# Patient Record
Sex: Male | Born: 1985 | Race: White | Marital: Single
Health system: Southern US, Community
[De-identification: ages and names within clinical notes are randomized; demographics above are authoritative.]

## PROBLEM LIST (undated history)

## (undated) HISTORY — PX: OTHER SURGICAL HISTORY: SHX169

---

## 2016-06-17 DIAGNOSIS — F988 Other specified behavioral and emotional disorders with onset usually occurring in childhood and adolescence: Secondary | ICD-10-CM | POA: Diagnosis not present

## 2016-08-26 DIAGNOSIS — R0981 Nasal congestion: Secondary | ICD-10-CM | POA: Diagnosis not present

## 2016-09-16 DIAGNOSIS — J343 Hypertrophy of nasal turbinates: Secondary | ICD-10-CM | POA: Diagnosis not present

## 2016-09-16 DIAGNOSIS — J342 Deviated nasal septum: Secondary | ICD-10-CM | POA: Diagnosis not present

## 2016-09-16 DIAGNOSIS — J310 Chronic rhinitis: Secondary | ICD-10-CM | POA: Diagnosis not present

## 2016-10-09 DIAGNOSIS — J3489 Other specified disorders of nose and nasal sinuses: Secondary | ICD-10-CM | POA: Diagnosis not present

## 2016-10-09 DIAGNOSIS — J343 Hypertrophy of nasal turbinates: Secondary | ICD-10-CM | POA: Diagnosis not present

## 2016-10-09 DIAGNOSIS — J342 Deviated nasal septum: Secondary | ICD-10-CM | POA: Diagnosis not present

## 2016-12-28 DIAGNOSIS — F988 Other specified behavioral and emotional disorders with onset usually occurring in childhood and adolescence: Secondary | ICD-10-CM | POA: Diagnosis not present

## 2017-08-11 DIAGNOSIS — F988 Other specified behavioral and emotional disorders with onset usually occurring in childhood and adolescence: Secondary | ICD-10-CM | POA: Diagnosis not present

## 2018-02-21 DIAGNOSIS — F988 Other specified behavioral and emotional disorders with onset usually occurring in childhood and adolescence: Secondary | ICD-10-CM | POA: Diagnosis not present

## 2018-06-28 DIAGNOSIS — M25552 Pain in left hip: Secondary | ICD-10-CM | POA: Diagnosis not present

## 2018-06-28 DIAGNOSIS — M76892 Other specified enthesopathies of left lower limb, excluding foot: Secondary | ICD-10-CM | POA: Diagnosis not present

## 2018-07-01 ENCOUNTER — Ambulatory Visit: Payer: BLUE CROSS/BLUE SHIELD | Attending: Family Medicine | Admitting: Physical Therapy

## 2018-07-01 ENCOUNTER — Other Ambulatory Visit: Payer: Self-pay

## 2018-07-01 ENCOUNTER — Encounter: Payer: Self-pay | Admitting: Physical Therapy

## 2018-07-01 DIAGNOSIS — R252 Cramp and spasm: Secondary | ICD-10-CM

## 2018-07-01 DIAGNOSIS — M25652 Stiffness of left hip, not elsewhere classified: Secondary | ICD-10-CM

## 2018-07-01 DIAGNOSIS — M25552 Pain in left hip: Secondary | ICD-10-CM | POA: Insufficient documentation

## 2018-07-01 DIAGNOSIS — M6281 Muscle weakness (generalized): Secondary | ICD-10-CM | POA: Insufficient documentation

## 2018-07-01 NOTE — Therapy (Signed)
[facility] [address] [place], [place], [address] Phone: [phone]   Fax:  [phone]  Physical Therapy Evaluation  Patient Details  Name: Gary Mann MRN: [id number] Date of Birth: 1986/02/12 Referring Provider: Daisy Floro, MD   Encounter Date: 07/01/2018  PT End of Session - 07/01/18 0912    Visit Number  1    Date for PT Re-Evaluation  08/12/18    PT Start Time  0830    PT Stop Time  0915    PT Time Calculation (min)  45 min    Activity Tolerance  Patient tolerated treatment well    Behavior During Therapy  [facility] for tasks assessed/performed       History reviewed. No pertinent past medical history.  Past Surgical History:  Procedure Laterality Date  . meniscus surgery Rt knee      There were no vitals filed for this visit.   Subjective Assessment - 07/01/18 0833    Subjective  Pt states pivoted on his left foot during disk golf and felt something pop and has had discomfort ever since then.  This occurred one year ago.  Pt had x-ray a few days ago that was unremarkable.      Limitations  Other (comment)   pivoting on left leg   Currently in Pain?  Yes    Pain Score  --   difficult to give number; reports is "annoying"   Pain Location  Hip    Pain Orientation  Left;Anterior    Pain Descriptors / Indicators  Pressure;Discomfort    Pain Type  Chronic pain    Pain Radiating Towards  sometimes mid thigh, never to knee    Pain Frequency  Intermittent    Aggravating Factors   worse in AM, feels hip abductor or adductor machine    Pain Relieving Factors  popping feels better for a little while    Effect of Pain on Daily Activities  no effect, but feels it all the time    Multiple Pain Sites  No         [facility] PT Assessment - 07/01/18 0001      Assessment   Medical Diagnosis  S76.012A (ICD-10-CM) - Strain of muscle, fascia and tendon of left hip, initial encounter    Referring Provider   Daisy Floro, MD    Onset Date/Surgical Date  --   one year ago   Prior Therapy  No      Precautions   Precautions  None      Restrictions   Weight Bearing Restrictions  No      Balance Screen   Has the patient fallen in the past 6 months  No      Home Environment   Living Environment  Private residence    Living Arrangements  Spouse/significant other      Prior Function   Level of Independence  Independent    Vocation  Full time employment      Cognition   Overall Cognitive Status  Within Functional Limits for tasks assessed      Observation/Other Assessments   Focus on Therapeutic Outcomes (FOTO)   31% limited      Functional Tests   Functional tests  Other      Other:   Other/ Comments  reproduction of stepping into a disk toss - forceful internal rotation on Lt hip      Posture/Postural Control   Posture/Postural Control  Postural limitations  Postural Limitations  Rounded Shoulders      ROM / Strength   AROM / PROM / Strength  Strength;PROM      PROM   Overall PROM Comments  left hip IR 30% limited; left hip ER >90 deg      Strength   Overall Strength Comments  all LE 5/5 except: left hip abduction 4+/5; left hip adduction 4+/5; right hip adduction 4+/5      Palpation   SI assessment   normal alignment    Palpation comment  TFL and adductors tight, no severe tenderness      Special Tests    Special Tests  Sacrolliac Tests    Other special tests  stretching to sacrospinus, sacrotuberous, sacrolumbar ligaments   mild pain provoked with all directions     Ambulation/Gait   Gait Pattern  Within Functional Limits                Objective measurements completed on examination: See above findings.      [facility] Adult PT Treatment/Exercise - 07/01/18 0001      Exercises   Exercises  Other Exercises    Other Exercises   stretch to IT band and hip IR             PT Education - 07/01/18 0918    Education Details   Access Code:  [email or web address]     Person(s) Educated  Patient    Methods  Explanation;Demonstration;Handout    Comprehension  Verbalized understanding;Returned demonstration       PT Short Term Goals - 07/01/18 0924      PT SHORT TERM GOAL #1   Title  ind with initial HEP    Time  3    Period  Weeks    Status  New    Target Date  07/22/18      PT SHORT TERM GOAL #2   Title  Pt will report at least 20% less pain when playing disk golf and when waking up in the morning    Time  3    Period  Weeks    Status  New    Target Date  07/22/18        PT Long Term Goals - 07/01/18 0913      PT LONG TERM GOAL #1   Title  Pt will be ind with advanced HEP    Time  6    Period  Weeks    Status  New    Target Date  08/12/18      PT LONG TERM GOAL #2   Title  FOTO will be < or = to 19% limited     Time  6    Period  Weeks    Status  New    Target Date  08/12/18      PT LONG TERM GOAL #3   Title  Pt will report ability to play disk golf confidently due to improved hip and core strength    Time  6    Period  Weeks    Status  New    Target Date  08/12/18      PT LONG TERM GOAL #4   Title  Pt will report 75% reduction in pain overall    Time  6    Period  Weeks    Status  New    Target Date  08/12/18      PT LONG TERM GOAL #5   Title  Pt will  be able to perform wide stance squat without increased pain    Time  6    Period  Weeks    Status  New    Target Date  08/12/18             Plan - 07/01/18 [id number]    Clinical Impression Statement  Pt reports to clinic due to hip pain that has been going on for a year.  Pain began after experienceing a feeling of dislocation during a game of disk golf.  Currently he has mild symptoms constantly and is aggravated when playing disk golf, hip abduction/adduction, and certain positions.  Pt has hip hypomobility with hip IR and hypermobile ER on the Lt LE.  Pt has some weakness of left hip.  Pt has increased symptoms with sacral ligament testing when place  left hip in adduction, flexion, and flexion with adduction.  Pt has muscle spasms of left TFL and bilateral adductors.  Pt overally appears to have some weakness of hip rotators, adductors and abductors with lack of core strength to stabiliize the pelvis during fast movements.  He will benefit from skilled PT to address impairments so he can safely and comfortably participate in his activies as part of a healthy lifestyle.    History and Personal Factors relevant to plan of care:  right knee meniscus surgery    Clinical Presentation  Stable    Clinical Presentation due to:  Pt is stable    Clinical Decision Making  Low    Rehab Potential  Excellent    PT Frequency  2x / week    PT Duration  6 weeks    PT Treatment/Interventions  ADLs/Self Care Home Management;Biofeedback;Cryotherapy;Electrical Stimulation;Iontophoresis 4mg /ml Dexamethasone;Moist Heat;Traction;Therapeutic activities;Therapeutic exercise;Patient/family education;Neuromuscular re-education;Manual techniques;Passive range of motion;Dry needling;Taping    PT Next Visit Plan  dry needling info and dry needle to TFL left side and adductors bilateral; hip stability and strengthening; core strength    PT Home Exercise Plan   Access Code: [email or web address]     Recommended Other Services  eval 07/01/18    Consulted and Agree with Plan of Care  Patient       Patient will benefit from skilled therapeutic intervention in order to improve the following deficits and impairments:  Pain, Decreased strength, Increased muscle spasms  Visit Diagnosis: Pain in left hip - Plan: PT plan of care cert/re-cert  Stiffness of left hip, not elsewhere classified - Plan: PT plan of care cert/re-cert  Muscle weakness (generalized) - Plan: PT plan of care cert/re-cert  Cramp and spasm - Plan: PT plan of care cert/re-cert     Problem List There are no active problems to display for this patient.   Vincente PoliJakki Crosser, PT 07/01/2018, 4:15 PM  [facility] [address] [place], [place], [address] Phone: [phone]   Fax:  [phone]  Name: Myrla HalstedMatthew Dech MRN: [id number] Date of Birth: [birth year]

## 2018-07-01 NOTE — Patient Instructions (Signed)
Access Code: [email or web address]  URL: [email or web address]  Date: 07/01/2018  Prepared by: Dorie RankJacqueline Crosser   Exercises  Supine Bilateral Hip Internal Rotation Stretch - 3 reps - 1 sets - 30 sec hold - 1x daily - 7x weekly  Standing ITB Stretch - 10 reps - 3 sets - 1x daily - 7x weekly   [facility] [address], [address] [place], [place] [address] Phone # [phone] Fax [phone]

## 2018-07-04 ENCOUNTER — Ambulatory Visit: Payer: BLUE CROSS/BLUE SHIELD | Admitting: Physical Therapy

## 2018-07-04 ENCOUNTER — Encounter: Payer: Self-pay | Admitting: Physical Therapy

## 2018-07-04 DIAGNOSIS — M25552 Pain in left hip: Secondary | ICD-10-CM | POA: Diagnosis not present

## 2018-07-04 DIAGNOSIS — M25652 Stiffness of left hip, not elsewhere classified: Secondary | ICD-10-CM

## 2018-07-04 DIAGNOSIS — R252 Cramp and spasm: Secondary | ICD-10-CM

## 2018-07-04 DIAGNOSIS — M6281 Muscle weakness (generalized): Secondary | ICD-10-CM

## 2018-07-04 NOTE — Therapy (Signed)
[facility] [address] [place], [place], [address] Phone: [phone]   Fax:  [phone]  Physical Therapy Treatment  Patient Details  Name: Gary Mann MRN: [id number] Date of Birth: 1986-08-23 Referring Provider: Daisy Floro, MD   Encounter Date: 07/04/2018  PT End of Session - 07/04/18 1147    Visit Number  2    Date for PT Re-Evaluation  08/12/18    PT Start Time  1144    PT Stop Time  1230    PT Time Calculation (min)  46 min    Activity Tolerance  Patient tolerated treatment well    Behavior During Therapy  [facility] for tasks assessed/performed       History reviewed. No pertinent past medical history.  Past Surgical History:  Procedure Laterality Date  . meniscus surgery Rt knee      There were no vitals filed for this visit.  Subjective Assessment - 07/04/18 1148    Subjective  Just my annoying pain.    Currently in Pain?  Yes    Pain Score  3     Pain Location  Hip    Pain Orientation  Left;Anterior;Lateral    Pain Descriptors / Indicators  Constant    Multiple Pain Sites  No                       [facility] Adult PT Treatment/Exercise - 07/04/18 0001      Neuro Re-ed    Neuro Re-ed Details   Verbal ed for increasing his perception and awareness of weight bearing throughout the LTLE during the bridge exercise and during standing exercises/activites   Pt could demo correctly     Knee/Hip Exercises: Stretches   ITB Stretch  Left;3 reps;20 seconds    Other Knee/Hip Stretches  Soft foam roll release for LT TFL and adductors.     Other Knee/Hip Stretches  Hip IR stretch Lt 3x 20 sec    Yoga traction twist stretch in supine, Vc for initial instru     Knee/Hip Exercises: Aerobic   Nustep  L5 x 6 min    PTA present for status update     Knee/Hip Exercises: Seated   Other Seated Knee/Hip Exercises  Hip isometric ER 5 sec hold 8x      Knee/Hip Exercises: Supine   Bridges   Strengthening;Both;1 set;10 reps             PT Education - 07/04/18 1504    Education Details  Supine bridge and seated hip ER isometrics for HEP    Person(s) Educated  Patient    Methods  Explanation;Demonstration;Tactile cues;Verbal cues;Handout    Comprehension  Returned demonstration;Verbalized understanding       PT Short Term Goals - 07/04/18 1500      PT SHORT TERM GOAL #1   Title  ind with initial HEP    Time  3    Period  Weeks    Status  Achieved        PT Long Term Goals - 07/01/18 0913      PT LONG TERM GOAL #1   Title  Pt will be ind with advanced HEP    Time  6    Period  Weeks    Status  New    Target Date  08/12/18      PT LONG TERM GOAL #2   Title  FOTO will be < or = to 19%  limited     Time  6    Period  Weeks    Status  New    Target Date  08/12/18      PT LONG TERM GOAL #3   Title  Pt will report ability to play disk golf confidently due to improved hip and core strength    Time  6    Period  Weeks    Status  New    Target Date  08/12/18      PT LONG TERM GOAL #4   Title  Pt will report 75% reduction in pain overall    Time  6    Period  Weeks    Status  New    Target Date  08/12/18      PT LONG TERM GOAL #5   Title  Pt will be able to perform wide stance squat without increased pain    Time  6    Period  Weeks    Status  New    Target Date  08/12/18            Plan - 07/04/18 1214    Clinical Impression Statement  Pt presented today with same anteriolateral LT hip "annoying" pain. He reports doing a gym routine already this AM. This consisted of a comprehensive LE machine workout; leg press, squats, and add/abd machine. He is compliant and independent in his initial HEP. PTA educated pt in ways to use the soft foam roll to perform release work to his LT TFL/IT band/and adductor group. He has a hard foam roll at home that is painful to use. Pt reports the soft foam roll was signifcantly more comfortable and could more  easily get to his sore "spots." He may order soft one for home use. Pt was educated in the importance of stabilization wrok for the hip and perhaps that was missing piece of his exercise regime. During the bridge exercise pt put less weight inot his LTLE with no awareness of this. When verbally cued to put more weight into his foot/leg he could "feel" his hip better and this produced a more visable shake at his hip muscluature. The bridge and isometric hip Er was added to HEP.     Rehab Potential  Excellent    PT Frequency  2x / week    PT Duration  6 weeks    PT Treatment/Interventions  ADLs/Self Care Home Management;Biofeedback;Cryotherapy;Electrical Stimulation;Iontophoresis 4mg /ml Dexamethasone;Moist Heat;Traction;Therapeutic activities;Therapeutic exercise;Patient/family education;Neuromuscular re-education;Manual techniques;Passive range of motion;Dry needling;Taping    PT Next Visit Plan  Pt might like to try dry needling next session; Lt TFL/LT hip adductors. Continue with hip stabilization along with the core    PT Home Exercise Plan   Access Code: [email or web address]     Consulted and Agree with Plan of Care  Patient       Patient will benefit from skilled therapeutic intervention in order to improve the following deficits and impairments:  Pain, Decreased strength, Increased muscle spasms  Visit Diagnosis: Pain in left hip  Stiffness of left hip, not elsewhere classified  Muscle weakness (generalized)  Cramp and spasm     Problem List There are no active problems to display for this patient.   Gary Mann, PTA 07/04/2018, 3:05 PM  [facility] [address] [place], [place], [address] Phone: [phone]   Fax:  [phone]  Name: Gary Mann MRN: [id number] Date of Birth: [birth year]  Access Code: [email or web address]  URL: [email or web address]  Date: 07/04/2018  Prepared by: Gary Mann   Exercises   Supine Bilateral Hip Internal Rotation Stretch - 3 reps - 1 sets - 30 sec hold - 1x daily - 7x weekly  Standing ITB Stretch - 10 reps - 3 sets - 1x daily - 7x weekly  Supine Bridge - 10 reps - 3 sets - 1x daily - 7x weekly  Seated Heel Squeeze - 10 reps - 3 sets - 1x daily - 7x weekly

## 2018-07-08 ENCOUNTER — Ambulatory Visit: Payer: BLUE CROSS/BLUE SHIELD | Admitting: Physical Therapy

## 2018-07-08 DIAGNOSIS — R252 Cramp and spasm: Secondary | ICD-10-CM

## 2018-07-08 DIAGNOSIS — M6281 Muscle weakness (generalized): Secondary | ICD-10-CM | POA: Diagnosis not present

## 2018-07-08 DIAGNOSIS — M25652 Stiffness of left hip, not elsewhere classified: Secondary | ICD-10-CM

## 2018-07-08 DIAGNOSIS — M25552 Pain in left hip: Secondary | ICD-10-CM

## 2018-07-08 NOTE — Patient Instructions (Addendum)
Trigger Point Dry Needling  . What is Trigger Point Dry Needling (DN)? o DN is a physical therapy technique used to treat muscle pain and dysfunction. Specifically, DN helps deactivate muscle trigger points (muscle knots).  o A thin filiform needle is used to penetrate the skin and stimulate the underlying trigger point. The goal is for a local twitch response (LTR) to occur and for the trigger point to relax. No medication of any kind is injected during the procedure.   . What Does Trigger Point Dry Needling Feel Like?  o The procedure feels different for each individual patient. Some patients report that they do not actually feel the needle enter the skin and overall the process is not painful. Very mild bleeding may occur. However, many patients feel a deep cramping in the muscle in which the needle was inserted. This is the local twitch response.   Marland Kitchen. How Will I feel after the treatment? o Soreness is normal, and the onset of soreness may not occur for a few hours. Typically this soreness does not last longer than two days.  o Bruising is uncommon, however; ice can be used to decrease any possible bruising.  o In rare cases feeling tired or nauseous after the treatment is normal. In addition, your symptoms may get worse before they get better, this period will typically not last longer than 24 hours.   . What Can I do After My Treatment? o Increase your hydration by drinking more water for the next 24 hours. o You may place ice or heat on the areas treated that have become sore, however, do not use heat on inflamed or bruised areas. Heat often brings more relief post needling. o You can continue your regular activities, but vigorous activity is not recommended initially after the treatment for 24 hours. o DN is best combined with other physical therapy such as strengthening, stretching, and other therapies.     Access Code: [email or web address]  URL: [email or web address]  Date:  07/08/2018  Prepared by: Lavinia SharpsStacy Simpson   Exercises  Supine Bilateral Hip Internal Rotation Stretch - 3 reps - 1 sets - 30 sec hold - 1x daily - 7x weekly  Standing ITB Stretch - 10 reps - 3 sets - 1x daily - 7x weekly  Supine Bridge - 10 reps - 3 sets - 1x daily - 7x weekly  Seated Heel Squeeze - 10 reps - 3 sets - 1x daily - 7x weekly  Hip Flexor Stretch at Edge of Bed - 3 reps - 1 sets - 20-30 hold - 1x daily - 7x weekly  Supine Hip External Rotation Stretch - 3 reps - 1 sets - 20-30 hold - 1x daily - 7x weekly  Standing Hip Flexor Stretch on Chair - 2 reps - 1 sets - 20-30 hold - 1x daily - 7x weekly  Half Kneeling Hip Flexor Stretch - 3 reps - 1 sets - 20-30 hold - 1x daily - 7x weekly

## 2018-07-08 NOTE — Therapy (Signed)
[facility] [address] [place], [place], [address] Phone: [phone]   Fax:  [phone]  Physical Therapy Treatment  Patient Details  Name: Myrla HalstedMatthew Taplin MRN: [id number] Date of Birth: Jan 01, 1986 Referring Provider: Daisy Florooss, Charles Alan, MD   Encounter Date: 07/08/2018  PT End of Session - 07/08/18 1350    Visit Number  3    Date for PT Re-Evaluation  08/12/18    PT Start Time  0845    PT Stop Time  0930    PT Time Calculation (min)  45 min    Activity Tolerance  Patient tolerated treatment well       No past medical history on file.  Past Surgical History:  Procedure Laterality Date  . meniscus surgery Rt knee      There were no vitals filed for this visit.  Subjective Assessment - 07/08/18 0849    Subjective  I went to chiropractor yesterday and they did manual adjustments to my hip.  Tennis ball size bruise left inguinal region which he reports is from the chiropractor's elbow.  Anterior left hip pain down anterior thigh.  Disk golf tournament this weekend.     Currently in Pain?  Yes    Pain Score  4     Pain Location  Hip    Pain Orientation  Left    Pain Type  Chronic pain                       [facility] Adult PT Treatment/Exercise - 07/08/18 0001      Knee/Hip Exercises: Stretches   Other Knee/Hip Stretches  supine hip flexor stretch, standing and 1/2 kneel stretch     Other Knee/Hip Stretches  supine hip internal rotation stretch using right heel to left knee 3x 20 sec       Knee/Hip Exercises: Standing   Other Standing Knee Exercises  2nd step HS and hip flexor, adductor stretches    Other Standing Knee Exercises  review of initial HEP      Moist Heat Therapy   Number Minutes Moist Heat  5 Minutes   while discussing HEP   Moist Heat Location  Hip      Manual Therapy   Manual Therapy  Soft tissue mobilization    Soft tissue mobilization  left rectus femoris, vastus  lateralis, ITB       Trigger Point Dry Needling - 07/08/18 1348    Consent Given?  Yes    Education Handout Provided  Yes    Muscles Treated Lower Body  Tensor fascia lata;Quadriceps    Tensor Fascia Lata Response  Palpable increased muscle length    Quadriceps Response  Twitch response elicited;Palpable increased muscle length           PT Education - 07/08/18 1345    Education Details   Access Code: [email or web address]   supine hip flexor stretch; supine hip internal rotation stretch; standing chair hip flexor stretch; half kneel hip flexor stretch     Person(s) Educated  Patient    Methods  Explanation;Demonstration;Handout    Comprehension  Returned demonstration;Verbalized understanding       PT Short Term Goals - 07/04/18 1500      PT SHORT TERM GOAL #1   Title  ind with initial HEP    Time  3    Period  Weeks    Status  Achieved        PT Long Term  Goals - 07/01/18 0913      PT LONG TERM GOAL #1   Title  Pt will be ind with advanced HEP    Time  6    Period  Weeks    Status  New    Target Date  08/12/18      PT LONG TERM GOAL #2   Title  FOTO will be < or = to 19% limited     Time  6    Period  Weeks    Status  New    Target Date  08/12/18      PT LONG TERM GOAL #3   Title  Pt will report ability to play disk golf confidently due to improved hip and core strength    Time  6    Period  Weeks    Status  New    Target Date  08/12/18      PT LONG TERM GOAL #4   Title  Pt will report 75% reduction in pain overall    Time  6    Period  Weeks    Status  New    Target Date  08/12/18      PT LONG TERM GOAL #5   Title  Pt will be able to perform wide stance squat without increased pain    Time  6    Period  Weeks    Status  New    Target Date  08/12/18            Plan - 07/08/18 0855    Clinical Impression Statement  The patient has tender points in rectus femoris, vastus lateralis and ITB on left.  He is receptive to DN and soft tissue mobilization  to address these points.  Avoided area of bruising.   Improved mobility following.  Hip internal rotation very limited.  Needs verbal cues to avoid compensatory trunk rotation rather than hip internal rotation stretch.      Rehab Potential  Excellent    PT Frequency  2x / week    PT Duration  6 weeks    PT Treatment/Interventions  ADLs/Self Care Home Management;Biofeedback;Cryotherapy;Electrical Stimulation;Iontophoresis 4mg /ml Dexamethasone;Moist Heat;Traction;Therapeutic activities;Therapeutic exercise;Patient/family education;Neuromuscular re-education;Manual techniques;Passive range of motion;Dry needling;Taping    PT Next Visit Plan  assess response to try dry needling left quad. Continue with hip stabilization along with the core    PT Home Exercise Plan   Access Code: [email or web address]        Patient will benefit from skilled therapeutic intervention in order to improve the following deficits and impairments:  Pain, Decreased strength, Increased muscle spasms  Visit Diagnosis: Pain in left hip  Stiffness of left hip, not elsewhere classified  Muscle weakness (generalized)  Cramp and spasm     Problem List There are no active problems to display for this patient.  Lavinia SharpsStacy Hooper [place], PT 07/08/18 2:11 PM Phone: [phone] Fax: [phone]  Vivien PrestoSimpson, Judithann Villamar C 07/08/2018, 2:09 PM  [facility] [address] [place], [place], [address] Phone: [phone]   Fax:  [phone]  Name: Myrla HalstedMatthew Kubota MRN: [id number] Date of Birth: 08-29-1986

## 2018-07-11 ENCOUNTER — Encounter: Payer: Self-pay | Admitting: Physical Therapy

## 2018-07-11 ENCOUNTER — Ambulatory Visit: Payer: BLUE CROSS/BLUE SHIELD | Admitting: Physical Therapy

## 2018-07-11 DIAGNOSIS — M25552 Pain in left hip: Secondary | ICD-10-CM

## 2018-07-11 DIAGNOSIS — M6281 Muscle weakness (generalized): Secondary | ICD-10-CM

## 2018-07-11 DIAGNOSIS — M25652 Stiffness of left hip, not elsewhere classified: Secondary | ICD-10-CM

## 2018-07-11 DIAGNOSIS — R252 Cramp and spasm: Secondary | ICD-10-CM

## 2018-07-11 NOTE — Therapy (Signed)
[facility] [address], [address] [place], [place], [address] Phone: [phone]   Fax:  [phone]  Physical Therapy Treatment  Patient Details  Name: Gary Mann MRN: [id number] Date of Birth: Jan 31, 1986 Referring Provider: Daisy Florooss, Charles Alan, MD   Encounter Date: 07/11/2018  PT End of Session - 07/11/18 1234    Visit Number  4    Date for PT Re-Evaluation  08/12/18    PT Start Time  1233    PT Stop Time  1311    PT Time Calculation (min)  38 min    Activity Tolerance  Patient tolerated treatment well    Behavior During Therapy  [facility] for tasks assessed/performed       History reviewed. No pertinent past medical history.  Past Surgical History:  Procedure Laterality Date  . meniscus surgery Rt knee      There were no vitals filed for this visit.  Subjective Assessment - 07/11/18 1236    Subjective  Was not sore from DN. He reports he felt good through the first portion of his golf tournament, then had some more of his regular symtpoms towards the end. This was an improvement.     Currently in Pain?  Yes    Pain Score  3     Pain Location  Hip    Pain Orientation  Left    Pain Descriptors / Indicators  Dull;Sore    Aggravating Factors   just constant    Pain Relieving Factors  DN helped    Multiple Pain Sites  No                       [facility] Adult PT Treatment/Exercise - 07/11/18 0001      Knee/Hip Exercises: Stretches   Hip Flexor Stretch  --   On foam roll Bil 3x 20 sec   Other Knee/Hip Stretches  supine hip internal rotation stretch using right heel to left knee 3x 20 sec       Knee/Hip Exercises: Aerobic   Stationary Bike  L2 x 6 min    PTA present to discuss status     Knee/Hip Exercises: Standing   Other Standing Knee Exercises  hard black ball release work on the wall for laterl hip, anterior, and post to greater trochanter              PT Education - 07/11/18 1309    Education Details  Yoga half moon with modification for HEP/hip stabilization,soft foam roll LE release techniques pt can do at home.    Person(s) Educated  Patient    Methods  Explanation;Demonstration;Tactile cues;Verbal cues;Handout    Comprehension  Returned demonstration;Verbalized understanding       PT Short Term Goals - 07/11/18 1250      PT SHORT TERM GOAL #2   Title  Pt will report at least 20% less pain when playing disk golf and when waking up in the morning    Time  3    Period  Weeks    Status  On-going   feeling more stiff and sore in the AM       PT Long Term Goals - 07/01/18 0913      PT LONG TERM GOAL #1   Title  Pt will be ind with advanced HEP    Time  6    Period  Weeks    Status  New    Target Date  08/12/18  PT LONG TERM GOAL #2   Title  FOTO will be < or = to 19% limited     Time  6    Period  Weeks    Status  New    Target Date  08/12/18      PT LONG TERM GOAL #3   Title  Pt will report ability to play disk golf confidently due to improved hip and core strength    Time  6    Period  Weeks    Status  New    Target Date  08/12/18      PT LONG TERM GOAL #4   Title  Pt will report 75% reduction in pain overall    Time  6    Period  Weeks    Status  New    Target Date  08/12/18      PT LONG TERM GOAL #5   Title  Pt will be able to perform wide stance squat without increased pain    Time  6    Period  Weeks    Status  New    Target Date  08/12/18            Plan - 07/11/18 1235    Clinical Impression Statement  Pt felt like the first dry needling session was very helpful. He was able to play in his disk gold tourney with maybe 50% less pain than previously. He purchased foam roll for home and is using every morning. Added the modifed half moon in standing to HEP to challenge his hip stability.      Rehab Potential  Excellent    PT Frequency  2x / week    PT Duration  6 weeks    PT Treatment/Interventions  ADLs/Self Care Home  Management;Biofeedback;Cryotherapy;Electrical Stimulation;Iontophoresis 4mg /ml Dexamethasone;Moist Heat;Traction;Therapeutic activities;Therapeutic exercise;Patient/family education;Neuromuscular re-education;Manual techniques;Passive range of motion;Dry needling;Taping    PT Next Visit Plan  DN 2 next    PT Home Exercise Plan   Access Code: [email or web address]     Consulted and Agree with Plan of Care  Patient       Patient will benefit from skilled therapeutic intervention in order to improve the following deficits and impairments:  Pain, Decreased strength, Increased muscle spasms  Visit Diagnosis: Pain in left hip  Stiffness of left hip, not elsewhere classified  Muscle weakness (generalized)  Cramp and spasm     Problem List There are no active problems to display for this patient.   Moni Rothrock, PTA 07/11/2018, 2:52 PM  [facility] [address], [address] [place], [place], [address] Phone: [phone]   Fax:  [phone]  Name: Gary HalstedMatthew Mann MRN: [id number] Date of Birth: 03-26-1986

## 2018-07-11 NOTE — Patient Instructions (Addendum)
Half Moon    In wide stance, hands on hips, rotate right leg out 90, left leg in 20. Bend right leg 90. Bend from hips and place right hand on block, left hand on hip. Raise left leg parallel to torso and straighten right leg, keeping sides straight. Reach left arm up and look up toward left hand. Hold for __4__ breaths. Repeat on other side. CAUTION: Do not lock knees. ADVANCED: Balance without block.  Copyright  VHI. All rights reserved.

## 2018-07-15 ENCOUNTER — Ambulatory Visit: Payer: BLUE CROSS/BLUE SHIELD | Admitting: Physical Therapy

## 2018-07-15 DIAGNOSIS — R252 Cramp and spasm: Secondary | ICD-10-CM | POA: Diagnosis not present

## 2018-07-15 DIAGNOSIS — M25552 Pain in left hip: Secondary | ICD-10-CM | POA: Diagnosis not present

## 2018-07-15 DIAGNOSIS — M25652 Stiffness of left hip, not elsewhere classified: Secondary | ICD-10-CM

## 2018-07-15 DIAGNOSIS — M6281 Muscle weakness (generalized): Secondary | ICD-10-CM

## 2018-07-15 NOTE — Therapy (Signed)
[facility] [address], [address] [place], [place], [address] Phone: [phone]   Fax:  [phone]  Physical Therapy Treatment  Patient Details  Name: Gary HalstedMatthew Mann MRN: [id number] Date of Birth: 1986/06/11 Referring Provider: Daisy Florooss, Charles Alan, MD   Encounter Date: 07/15/2018  PT End of Session - 07/15/18 1208    Visit Number  5    Date for PT Re-Evaluation  08/12/18    PT Start Time  1018    PT Stop Time  1100    PT Time Calculation (min)  42 min    Activity Tolerance  Patient tolerated treatment well       No past medical history on file.  Past Surgical History:  Procedure Laterality Date  . meniscus surgery Rt knee      There were no vitals filed for this visit.  Subjective Assessment - 07/15/18 1023    Subjective  Left hip flexor region continues today and every day.  I was pleased that the DN helped with 2 days of disc golf tournament "but then it came back and dropped me."   Turning my hip inward feels very tight.  Outward seems OK.     Currently in Pain?  Yes    Pain Score  5     Pain Location  Hip    Pain Orientation  Left                       [facility] Adult PT Treatment/Exercise - 07/15/18 0001      Exercises   Other Exercises   verbal review of current HEP       Lumbar Exercises: Quadruped   Other Quadruped Lumbar Exercises  1/2 kneel hip flexor stretches with LE biased for internal and external rotation and straight    Other Quadruped Lumbar Exercises  1/2 kneel with trunk rotation and sidebending        Moist Heat Therapy   Number Minutes Moist Heat  5 Minutes   while discussing HEP   Moist Heat Location  Hip      Manual Therapy   Manual therapy comments  prone knee bend with overpressure     Joint Mobilization  prone PA straight, internal rotation and external rotation grade 4 3x20 sec each     Soft tissue mobilization  left rectus femoris, vastus lateralis, ITB        Trigger Point Dry Needling - 07/15/18 1210    Consent Given?  Yes    Quadriceps Response  Twitch response elicited;Palpable increased muscle length   rectus fermoris and sartorius            PT Short Term Goals - 07/11/18 1250      PT SHORT TERM GOAL #2   Title  Pt will report at least 20% less pain when playing disk golf and when waking up in the morning    Time  3    Period  Weeks    Status  On-going   feeling more stiff and sore in the AM       PT Long Term Goals - 07/01/18 0913      PT LONG TERM GOAL #1   Title  Pt will be ind with advanced HEP    Time  6    Period  Weeks    Status  New    Target Date  08/12/18      PT LONG TERM GOAL #2  Title  FOTO will be < or = to 19% limited     Time  6    Period  Weeks    Status  New    Target Date  08/12/18      PT LONG TERM GOAL #3   Title  Pt will report ability to play disk golf confidently due to improved hip and core strength    Time  6    Period  Weeks    Status  New    Target Date  08/12/18      PT LONG TERM GOAL #4   Title  Pt will report 75% reduction in pain overall    Time  6    Period  Weeks    Status  New    Target Date  08/12/18      PT LONG TERM GOAL #5   Title  Pt will be able to perform wide stance squat without increased pain    Time  6    Period  Weeks    Status  New    Target Date  08/12/18            Plan - 07/15/18 1208    Clinical Impression Statement  Hip internal and external rotation ROM is much improved.  Primary complaint of pain is more anterior thigh vs. antero-lateral last week.  Tender points along sartorius and rectus femoris with twitch responses produced (good prognostic indicator).   Decreased size and number of points following DN and manual therapy.      Rehab Potential  Excellent    PT Frequency  2x / week    PT Duration  6 weeks    PT Treatment/Interventions  ADLs/Self Care Home Management;Biofeedback;Cryotherapy;Electrical Stimulation;Iontophoresis  4mg /ml Dexamethasone;Moist Heat;Traction;Therapeutic activities;Therapeutic exercise;Patient/family education;Neuromuscular re-education;Manual techniques;Passive range of motion;Dry needling;Taping    PT Next Visit Plan  may add ES to DN;  possibly add iliopsoas to DN; hip and core stabilization;  instrument assisted STW to quads    PT Home Exercise Plan   Access Code: [email or web address]        Patient will benefit from skilled therapeutic intervention in order to improve the following deficits and impairments:  Pain, Decreased strength, Increased muscle spasms  Visit Diagnosis: Pain in left hip  Stiffness of left hip, not elsewhere classified  Muscle weakness (generalized)  Cramp and spasm     Problem List There are no active problems to display for this patient.  Lavinia Sharps, PT 07/15/18 12:16 PM Phone: [phone] Fax: [phone] Vivien Presto 07/15/2018, 12:15 PM  [facility] [address], [address] [place], [place], [address] Phone: [phone]   Fax:  [phone]  Name: Gary Mann MRN: [id number] Date of Birth: 1986-04-22

## 2018-07-18 ENCOUNTER — Encounter: Payer: Self-pay | Admitting: Physical Therapy

## 2018-07-18 ENCOUNTER — Ambulatory Visit: Payer: BLUE CROSS/BLUE SHIELD | Admitting: Physical Therapy

## 2018-07-18 DIAGNOSIS — M25652 Stiffness of left hip, not elsewhere classified: Secondary | ICD-10-CM | POA: Diagnosis not present

## 2018-07-18 DIAGNOSIS — M25552 Pain in left hip: Secondary | ICD-10-CM | POA: Diagnosis not present

## 2018-07-18 DIAGNOSIS — R252 Cramp and spasm: Secondary | ICD-10-CM | POA: Diagnosis not present

## 2018-07-18 DIAGNOSIS — M6281 Muscle weakness (generalized): Secondary | ICD-10-CM

## 2018-07-18 NOTE — Therapy (Signed)
[facility] [address], [address] [place], [place], [address] Phone: [phone]   Fax:  [phone]  Physical Therapy Treatment  Patient Details  Name: Gary Mann MRN: [id number] Date of Birth: 1986/04/30 Referring Provider: Daisy Florooss, Charles Alan, MD   Encounter Date: 07/18/2018  PT End of Session - 07/18/18 0852    Visit Number  6    Date for PT Re-Evaluation  08/12/18    PT Start Time  0850    PT Stop Time  0930    PT Time Calculation (min)  40 min    Activity Tolerance  Patient tolerated treatment well    Behavior During Therapy  [facility] for tasks assessed/performed       History reviewed. No pertinent past medical history.  Past Surgical History:  Procedure Laterality Date  . meniscus surgery Rt knee      There were no vitals filed for this visit.  Subjective Assessment - 07/18/18 0854    Subjective  The DN did not help last session. I feel like there is no change in my symptoms.     Currently in Pain?  Yes    Pain Score  3     Pain Location  Hip    Pain Orientation  Left    Pain Descriptors / Indicators  Dull;Aching    Aggravating Factors   constant    Pain Relieving Factors  Not much    Multiple Pain Sites  No                       [facility] Adult PT Treatment/Exercise - 07/18/18 0001      Knee/Hip Exercises: Aerobic   Stationary Bike  L3 x 10 min   PTA present for status update/goals     Knee/Hip Exercises: Standing   Lateral Step Up  Left;2 sets;20 reps;Hand Hold: 1   On BOSU   Forward Step Up  Left;2 sets;20 reps;Hand Hold: 1   BOSU ball, VC for > weight bearing   SLS  With red ball plyo toss 3x 20     Other Standing Knee Exercises  --      Knee/Hip Exercises: Sidelying   Clams  Green band 2x 20              PT Education - 07/18/18 0925    Education Details  [employer] clamshells    Person(s) Educated  Patient    Methods  Explanation;Demonstration;Tactile cues;Verbal cues     Comprehension  Returned demonstration;Verbalized understanding       PT Short Term Goals - 07/18/18 0856      PT SHORT TERM GOAL #2   Title  Pt will report at least 20% less pain when playing disk golf and when waking up in the morning    Time  3    Period  Weeks    Status  On-going        PT Long Term Goals - 07/01/18 0913      PT LONG TERM GOAL #1   Title  Pt will be ind with advanced HEP    Time  6    Period  Weeks    Status  New    Target Date  08/12/18      PT LONG TERM GOAL #2   Title  FOTO will be < or = to 19% limited     Time  6    Period  Weeks    Status  New    Target Date  08/12/18      PT LONG TERM GOAL #3   Title  Pt will report ability to play disk golf confidently due to improved hip and core strength    Time  6    Period  Weeks    Status  New    Target Date  08/12/18      PT LONG TERM GOAL #4   Title  Pt will report 75% reduction in pain overall    Time  6    Period  Weeks    Status  New    Target Date  08/12/18      PT LONG TERM GOAL #5   Title  Pt will be able to perform wide stance squat without increased pain    Time  6    Period  Weeks    Status  New    Target Date  08/12/18            Plan - 07/18/18 0853    Clinical Impression Statement  Pt reporting overall he feels no change in his symptoms. He does think the PT is helping him find better hip stabilization which he might be missing from his current exercise program in the gym. He plans to follow up with his MD to discuss having an MRI. Exercises today focused on Lt hip stabilization on level and unlevel surfaces.  Visable shaking throughout the session of the LTLE.  Sidelying resistive clam exercise provoked his pain.     Rehab Potential  Excellent    PT Frequency  2x / week    PT Duration  6 weeks    PT Treatment/Interventions  ADLs/Self Care Home Management;Biofeedback;Cryotherapy;Electrical Stimulation;Iontophoresis 4mg /ml Dexamethasone;Moist Heat;Traction;Therapeutic  activities;Therapeutic exercise;Patient/family education;Neuromuscular re-education;Manual techniques;Passive range of motion;Dry needling;Taping    PT Next Visit Plan  Pt plans to return to DR Tenny Craw to discuss MRI, but her would like do more PT to work on strength of his hip and try the Estim with DN Friday.     PT Home Exercise Plan   Access Code: [email or web address]     Consulted and Agree with Plan of Care  Patient       Patient will benefit from skilled therapeutic intervention in order to improve the following deficits and impairments:  Pain, Decreased strength, Increased muscle spasms  Visit Diagnosis: Pain in left hip  Stiffness of left hip, not elsewhere classified  Muscle weakness (generalized)  Cramp and spasm     Problem List There are no active problems to display for this patient.   Cruz Bong, PTA 07/18/2018, 9:31 AM  [facility] [address] [place], [place], [address] Phone: [phone]   Fax:  [phone]  Name: Gary Mann MRN: [id number] Date of Birth: 11-19-1986

## 2018-07-19 DIAGNOSIS — M25552 Pain in left hip: Secondary | ICD-10-CM | POA: Diagnosis not present

## 2018-07-22 ENCOUNTER — Encounter: Payer: Self-pay | Admitting: Physical Therapy

## 2018-07-22 ENCOUNTER — Ambulatory Visit: Payer: BLUE CROSS/BLUE SHIELD | Admitting: Physical Therapy

## 2018-07-22 DIAGNOSIS — M25552 Pain in left hip: Secondary | ICD-10-CM

## 2018-07-22 DIAGNOSIS — M6281 Muscle weakness (generalized): Secondary | ICD-10-CM

## 2018-07-22 DIAGNOSIS — R252 Cramp and spasm: Secondary | ICD-10-CM | POA: Diagnosis not present

## 2018-07-22 DIAGNOSIS — M25652 Stiffness of left hip, not elsewhere classified: Secondary | ICD-10-CM | POA: Diagnosis not present

## 2018-07-22 NOTE — Therapy (Signed)
[facility] [address] [place], [place], [address] Phone: [phone]   Fax:  [phone]  Physical Therapy Treatment  Patient Details  Name: Gary Mann MRN: [id number] Date of Birth: 09/11/1986 Referring Provider: Daisy Floro, MD   Encounter Date: 07/22/2018  PT End of Session - 07/22/18 0915    Visit Number  7    Date for PT Re-Evaluation  08/12/18    PT Start Time  0845    PT Stop Time  0930      PT Time Calculation (min)  45 min    Activity Tolerance  Patient tolerated treatment well       History reviewed. No pertinent past medical history.  Past Surgical History:  Procedure Laterality Date  . meniscus surgery Rt knee      There were no vitals filed for this visit.  Subjective Assessment - 07/22/18 0849    Subjective  I had a back x-ray "L4-sacral hyperextended".  They put in request for MRI for hip and lumbar spine.  Reports haven't really had back pain but a few twinges every now and then.  Anterior/lateral thigh pain proximal although earlier in the week had lateral knee pain.  Had some popping at the gym with trunk rotation opposite way.      Currently in Pain?  Yes    Pain Score  3     Pain Orientation  Left                       [facility] Adult PT Treatment/Exercise - 07/22/18 0001      Knee/Hip Exercises: Seated   Other Seated Knee/Hip Exercises  review of HEP and discussion of focus areas;  discussion of x-ray results and anatomy of spine relating to findings      Moist Heat Therapy   Number Minutes Moist Heat  5 Minutes    Moist Heat Location  Hip      Electrical Stimulation   Electrical Stimulation Location  left quads    Electrical Stimulation Action  with DN     Electrical Stimulation Parameters  2 1/2 ma 15 min     Electrical Stimulation Goals  Pain      Manual Therapy   Manual therapy comments  prone knee bend with overpressure     Joint Mobilization   prone PA straight, internal rotation and external rotation grade 4 3x20 sec each     Soft tissue mobilization  left rectus femoris, vastus lateralis, ITB       Trigger Point Dry Needling - 07/22/18 0942    Consent Given?  Yes    Tensor Fascia Lata Response  Twitch response elicited;Palpable increased muscle length    Quadriceps Response  Twitch response elicited;Palpable increased muscle length             PT Short Term Goals - 07/18/18 0856      PT SHORT TERM GOAL #2   Title  Pt will report at least 20% less pain when playing disk golf and when waking up in the morning    Time  3    Period  Weeks    Status  On-going        PT Long Term Goals - 07/01/18 0913      PT LONG TERM GOAL #1   Title  Pt will be ind with advanced HEP    Time  6    Period  Weeks  Status  New    Target Date  08/12/18      PT LONG TERM GOAL #2   Title  FOTO will be < or = to 19% limited     Time  6    Period  Weeks    Status  New    Target Date  08/12/18      PT LONG TERM GOAL #3   Title  Pt will report ability to play disk golf confidently due to improved hip and core strength    Time  6    Period  Weeks    Status  New    Target Date  08/12/18      PT LONG TERM GOAL #4   Title  Pt will report 75% reduction in pain overall    Time  6    Period  Weeks    Status  New    Target Date  08/12/18      PT LONG TERM GOAL #5   Title  Pt will be able to perform wide stance squat without increased pain    Time  6    Period  Weeks    Status  New    Target Date  08/12/18            Plan - 07/22/18 0936    Clinical Impression Statement  The patient reports good compliance with HEP and gym program.  He is awaiting an appt for MRI of thigh, hip and lumbar spine.  He is interested in adding ES to DN.  Tender points palpated along vastus lateralis muscle and treatment focused on this area.  Some twitch responses produced which is a good prognostic indicator.  No limitations in hip  internal or external ROM.  Good quad muscle length.      Rehab Potential  Excellent    PT Frequency  2x / week    PT Duration  6 weeks    PT Treatment/Interventions  ADLs/Self Care Home Management;Biofeedback;Cryotherapy;Electrical Stimulation;Iontophoresis 4mg /ml Dexamethasone;Moist Heat;Traction;Therapeutic activities;Therapeutic exercise;Patient/family education;Neuromuscular re-education;Manual techniques;Passive range of motion;Dry needling;Taping    PT Next Visit Plan  assess response to DN #3 with added ES; continue hip and core strengthening;  check for further progress toward goals    PT Home Exercise Plan   Access Code: [email or web address]        Patient will benefit from skilled therapeutic intervention in order to improve the following deficits and impairments:  Pain, Decreased strength, Increased muscle spasms  Visit Diagnosis: Pain in left hip  Stiffness of left hip, not elsewhere classified  Muscle weakness (generalized)  Cramp and spasm     Problem List There are no active problems to display for this patient.  Lavinia SharpsStacy Brylin Stanislawski, PT 07/22/18 9:51 AM Phone: [phone] Fax: [phone] Vivien PrestoSimpson, Bennie Chirico C 07/22/2018, 9:50 AM  [facility] [address] [place], [place], [address] Phone: [phone]   Fax:  [phone]  Name: Gary HalstedMatthew Mann MRN: [id number] Date of Birth: [birth year]

## 2018-07-26 ENCOUNTER — Other Ambulatory Visit: Payer: Self-pay | Admitting: Family Medicine

## 2018-07-26 DIAGNOSIS — M25552 Pain in left hip: Secondary | ICD-10-CM

## 2018-07-27 ENCOUNTER — Ambulatory Visit: Payer: BLUE CROSS/BLUE SHIELD | Attending: Family Medicine | Admitting: Physical Therapy

## 2018-07-27 ENCOUNTER — Encounter: Payer: Self-pay | Admitting: Physical Therapy

## 2018-07-27 DIAGNOSIS — R252 Cramp and spasm: Secondary | ICD-10-CM | POA: Diagnosis not present

## 2018-07-27 DIAGNOSIS — M6281 Muscle weakness (generalized): Secondary | ICD-10-CM | POA: Diagnosis not present

## 2018-07-27 DIAGNOSIS — M25552 Pain in left hip: Secondary | ICD-10-CM | POA: Diagnosis not present

## 2018-07-27 DIAGNOSIS — M25652 Stiffness of left hip, not elsewhere classified: Secondary | ICD-10-CM | POA: Diagnosis not present

## 2018-07-27 NOTE — Therapy (Signed)
[facility] [address] [place], [place], [address] Phone: [phone]   Fax:  [phone]  Physical Therapy Treatment  Patient Details  Name: Gary Mann MRN: [id number] Date of Birth: January 21, 1986 Referring Provider: Daisy Floro, MD   Encounter Date: 07/27/2018  PT End of Session - 07/27/18 1545    Visit Number  8    Date for PT Re-Evaluation  08/12/18    PT Start Time  1533    PT Stop Time  1611    PT Time Calculation (min)  38 min    Activity Tolerance  Patient tolerated treatment well    Behavior During Therapy  Gary Mann for tasks assessed/performed       History reviewed. No pertinent past medical history.  Past Surgical History:  Procedure Laterality Date  . meniscus surgery Rt knee      There were no vitals filed for this visit.  Subjective Assessment - 07/27/18 1546    Subjective  I felt good after the last DN session. My pain is down to a 2/10 level. MRI scheduled next week.     Currently in Pain?  Yes    Pain Score  2     Pain Location  Hip    Pain Orientation  Left    Pain Descriptors / Indicators  Dull    Aggravating Factors   constant    Pain Relieving Factors  dry needling    Multiple Pain Sites  No                       [facility] Adult PT Treatment/Exercise - 07/27/18 0001      Knee/Hip Exercises: Aerobic   Stationary Bike  L5x 10 min   PTA present for status update/goals     Knee/Hip Exercises: Standing   SLS  20x with red plyotoss 20x level surface, blue pod SLS with toss 2x 20    VC to contract his buttocks more   Other Standing Knee Exercises  Picking up cone off floor in SLS with bird dog posture 10x.     Other Standing Knee Exercises  Warrior 2 30 sec x3                PT Short Term Goals - 07/18/18 0856      PT SHORT TERM GOAL #2   Title  Pt will report at least 20% less pain when playing disk golf and when waking up in the morning    Time  3    Period  Weeks    Status  On-going        PT Long Term Goals - 07/01/18 0913      PT LONG TERM GOAL #1   Title  Pt will be ind with advanced HEP    Time  6    Period  Weeks    Status  New    Target Date  08/12/18      PT LONG TERM GOAL #2   Title  FOTO will be < or = to 19% limited     Time  6    Period  Weeks    Status  New    Target Date  08/12/18      PT LONG TERM GOAL #3   Title  Pt will report ability to play disk golf confidently due to improved hip and core strength    Time  6    Period  Weeks  Status  New    Target Date  08/12/18      PT LONG TERM GOAL #4   Title  Pt will report 75% reduction in pain overall    Time  6    Period  Weeks    Status  New    Target Date  08/12/18      PT LONG TERM GOAL #5   Title  Pt will be able to perform wide stance squat without increased pain    Time  6    Period  Weeks    Status  New    Target Date  08/12/18            Plan - 07/27/18 1545    Clinical Impression Statement  Pt feeling pretty good overe the last few days, reporting his pain has been more a 2/10 vs 3/10. Todyas treatment focused mainly on LT hip stabilization exercises, adding Warrior 2 to his HEP.  Visable shaking throuhgout the exercises, VC on contracting his glutes more.     Rehab Potential  Excellent    PT Frequency  2x / week    PT Duration  6 weeks    PT Treatment/Interventions  ADLs/Self Care Home Management;Biofeedback;Cryotherapy;Electrical Stimulation;Iontophoresis 4mg /ml Dexamethasone;Moist Heat;Traction;Therapeutic activities;Therapeutic exercise;Patient/family education;Neuromuscular re-education;Manual techniques;Passive range of motion;Dry needling;Taping    PT Next Visit Plan  Review last HEp given, Warrior 2, SLS exercises for hip stability, DN    PT Home Exercise Plan   Access Code: [email or web address]     Consulted and Agree with Plan of Care  Patient       Patient will benefit from skilled therapeutic intervention in order to improve the  following deficits and impairments:  Pain, Decreased strength, Increased muscle spasms  Visit Diagnosis: Pain in left hip  Stiffness of left hip, not elsewhere classified  Muscle weakness (generalized)  Cramp and spasm     Problem List There are no active problems to display for this patient.   Gary Mann, PTA 07/27/2018, 4:13 PM  [facility] [address] [place], [place], [address] Phone: [phone]   Fax:  [phone]  Name: Gary Mann MRN: [id number] Date of Birth: Dec 02, 1985  Access Code: [email or web address]  URL: [email or web address]  Date: 07/27/2018  Prepared by: Gary Mann   Exercises  Supine Bilateral Hip Internal Rotation Stretch - 3 reps - 1 sets - 30 sec hold - 1x daily - 7x weekly  Standing ITB Stretch - 10 reps - 3 sets - 1x daily - 7x weekly  Supine Bridge - 10 reps - 3 sets - 1x daily - 7x weekly  Seated Heel Squeeze - 10 reps - 3 sets - 1x daily - 7x weekly  Hip Flexor Stretch at Edge of Bed - 3 reps - 1 sets - 20-30 hold - 1x daily - 7x weekly  Supine Hip External Rotation Stretch - 3 reps - 1 sets - 20-30 hold - 1x daily - 7x weekly  Standing Hip Flexor Stretch on Chair - 2 reps - 1 sets - 20-30 hold - 1x daily - 7x weekly  Half Kneeling Hip Flexor Stretch - 3 reps - 1 sets - 20-30 hold - 1x daily - 7x weekly  Warrior II - 2 reps - 2 sets - 4 hold - 1x daily - 7x weekly

## 2018-07-29 ENCOUNTER — Encounter: Payer: Self-pay | Admitting: Physical Therapy

## 2018-07-29 ENCOUNTER — Ambulatory Visit: Payer: BLUE CROSS/BLUE SHIELD | Admitting: Physical Therapy

## 2018-07-29 DIAGNOSIS — R252 Cramp and spasm: Secondary | ICD-10-CM | POA: Diagnosis not present

## 2018-07-29 DIAGNOSIS — M25652 Stiffness of left hip, not elsewhere classified: Secondary | ICD-10-CM | POA: Diagnosis not present

## 2018-07-29 DIAGNOSIS — M6281 Muscle weakness (generalized): Secondary | ICD-10-CM | POA: Diagnosis not present

## 2018-07-29 DIAGNOSIS — M25552 Pain in left hip: Secondary | ICD-10-CM

## 2018-07-29 NOTE — Therapy (Signed)
[facility] [address] [place], [place], [address] Phone: [phone]   Fax:  [phone]  Physical Therapy Treatment  Patient Details  Name: Gary Mann MRN: [id number] Date of Birth: May 23, 1986 Referring Provider: Daisy Floro, MD   Encounter Date: 07/29/2018  PT End of Session - 07/29/18 0911    Visit Number  9    Date for PT Re-Evaluation  08/12/18    PT Start Time  0845    PT Stop Time  0930   DN and heat    PT Time Calculation (min)  45 min    Activity Tolerance  Patient tolerated treatment well       History reviewed. No pertinent past medical history.  Past Surgical History:  Procedure Laterality Date  . meniscus surgery Rt knee      There were no vitals filed for this visit.  Subjective Assessment - 07/29/18 0849    Subjective  I pulled an all-nighter at work last night.  I did well after the DN last visit but then I played disc golf and got back into pain by hole 12.  I actually noticed I did better playing with less pain when I wore my sneakers instead of my disc golf shoes which are heavier.  I have a tournament this weekend and will try wearing the sneakers.  The pain I feel isn't going down to my knee at this point.  It stays in the upper front of my thigh and travels down about a third of the length of my thigh.    Currently in Pain?  Yes    Pain Score  2    with Lt LE push off and Rt trunk rotation   Pain Location  Hip    Pain Orientation  Left;Anterior    Pain Descriptors / Indicators  Dull    Pain Type  Chronic pain    Pain Frequency  Intermittent   with Lt LE push off and Rt trunk rotation                      [facility] Adult PT Treatment/Exercise - 07/29/18 0001      Moist Heat Therapy   Number Minutes Moist Heat  5 Minutes    Moist Heat Location  Hip      Electrical Stimulation   Electrical Stimulation Location  left quads    Electrical Stimulation Action   with DN    Electrical Stimulation Parameters  3 ma 15 min     Electrical Stimulation Goals  Pain      Manual Therapy   Soft tissue mobilization  left rectus femoris, vastus lateralis, ITB       Trigger Point Dry Needling - 07/29/18 0859    Quadriceps Response  Twitch response elicited;Palpable increased muscle length  (Pended)              PT Short Term Goals - 07/18/18 0856      PT SHORT TERM GOAL #2   Title  Pt will report at least 20% less pain when playing disk golf and when waking up in the morning    Time  3    Period  Weeks    Status  On-going        PT Long Term Goals - 07/01/18 0913      PT LONG TERM GOAL #1   Title  Pt will be ind with advanced HEP    Time  6    Period  Weeks    Status  New    Target Date  08/12/18      PT LONG TERM GOAL #2   Title  FOTO will be < or = to 19% limited     Time  6    Period  Weeks    Status  New    Target Date  08/12/18      PT LONG TERM GOAL #3   Title  Pt will report ability to play disk golf confidently due to improved hip and core strength    Time  6    Period  Weeks    Status  New    Target Date  08/12/18      PT LONG TERM GOAL #4   Title  Pt will report 75% reduction in pain overall    Time  6    Period  Weeks    Status  New    Target Date  08/12/18      PT LONG TERM GOAL #5   Title  Pt will be able to perform wide stance squat without increased pain    Time  6    Period  Weeks    Status  New    Target Date  08/12/18            Plan - 07/29/18 0912    Clinical Impression Statement  The patient reports good compliance with home and gym program and went to the gym before arriving to PT despite working until 4:30AM.  He reports a good response to DN with ES but the pain returns while he does a cross stretch and push off with left LE when playing disc golf.  He has a tournament this weekend and plans to wear regular shoes to see if less plan compared to his usual disc golf shoes.  Plans to have an  MRI next week.  Good initial response to DN/ES.  Slight motor response with ES.      Rehab Potential  Excellent    PT Frequency  2x / week    PT Duration  6 weeks    PT Treatment/Interventions  ADLs/Self Care Home Management;Biofeedback;Cryotherapy;Electrical Stimulation;Iontophoresis 4mg /ml Dexamethasone;Moist Heat;Traction;Therapeutic activities;Therapeutic exercise;Patient/family education;Neuromuscular re-education;Manual techniques;Passive range of motion;Dry needling;Taping    PT Next Visit Plan  assess response to DN with ES to left quads #2;  continue hip strengthening/stabilization     PT Home Exercise Plan   Access Code: [email or web address]        Patient will benefit from skilled therapeutic intervention in order to improve the following deficits and impairments:  Pain, Decreased strength, Increased muscle spasms  Visit Diagnosis: Pain in left hip  Stiffness of left hip, not elsewhere classified  Muscle weakness (generalized)  Cramp and spasm     Problem List There are no active problems to display for this patient.  Lavinia Sharps, PT 07/29/18 11:39 AM Phone: [phone] Fax: [phone]  Vivien Presto 07/29/2018, 11:37 AM  [facility] [address] [place], [place], [address] Phone: [phone]   Fax:  [phone]  Name: Sohail Capraro MRN: [id number] Date of Birth: 24-May-1986

## 2018-08-01 ENCOUNTER — Encounter: Payer: Self-pay | Admitting: Physical Therapy

## 2018-08-01 ENCOUNTER — Ambulatory Visit: Payer: BLUE CROSS/BLUE SHIELD | Admitting: Physical Therapy

## 2018-08-01 DIAGNOSIS — M6281 Muscle weakness (generalized): Secondary | ICD-10-CM

## 2018-08-01 DIAGNOSIS — R252 Cramp and spasm: Secondary | ICD-10-CM | POA: Diagnosis not present

## 2018-08-01 DIAGNOSIS — M25652 Stiffness of left hip, not elsewhere classified: Secondary | ICD-10-CM | POA: Diagnosis not present

## 2018-08-01 DIAGNOSIS — M25552 Pain in left hip: Secondary | ICD-10-CM

## 2018-08-01 NOTE — Therapy (Signed)
[facility] [address] [place], [place], [address] Phone: [phone]   Fax:  [phone]  Physical Therapy Treatment  Patient Details  Name: Gary Mann MRN: [id number] Date of Birth: 07/06/1986 Referring Provider: Ross, Charles Alan, MD   Encounter Date: 08/01/2018  PT End of Session - 08/01/18 1237    Visit Number  10    Date for PT Re-Evaluation  08/12/18    PT Start Time  1233    PT Stop Time  1311    PT Time Calculation (min)  38 min    Activity Tolerance  Patient tolerated treatment well    Behavior During Therapy  WFL for tasks assessed/performed       History reviewed. No pertinent past medical history.  Past Surgical History:  Procedure Laterality Date  . meniscus surgery Rt knee      There were no vitals filed for this visit.  Subjective Assessment - 08/01/18 1239    Subjective  Had no hip pain over the weekend! Still has no pain today.     Currently in Pain?  No/denies    Multiple Pain Sites  No         [facility] PT Assessment - 08/01/18 0001      Strength   Overall Strength Comments  LT hip abd 4+-5/5,                    [facility] Adult PT Treatment/Exercise - 08/01/18 0001      Knee/Hip Exercises: Aerobic   Stationary Bike  L5x 10 min   PTA present for status update/goals     Knee/Hip Exercises: Standing   SLS  Blue pod/oval with red plyotoss 3x 20     Other Standing Knee Exercises  [place] [facility] static squats 3x 20 sec.    Vc for proper alignment in LTLE/foot   Other Standing Knee Exercises  Reverse walking with cable 35# 20x    Warrior 2 3x 45 seconds              PT Short Term Goals - 08/01/18 1312      PT SHORT TERM GOAL #2   Title  Pt will report at least 20% less pain when playing disk golf and when waking up in the morning    Time  3    Period  Weeks    Status  On-going   Played the first time this past weekend without pain, so some what early  to  give a %.       PT Long Term Goals - 08/01/18 1312      PT LONG TERM GOAL #1   Title  Pt will be ind with advanced HEP    Time  6    Period  Weeks    Status  On-going      PT LONG TERM GOAL #3   Title  Pt will report ability to play disk golf confidently Mann to improved hip and core strength    Time  6    Period  Weeks    Status  On-going      PT LONG TERM GOAL #4   Title  Pt will report 75% reduction in pain overall    Time  6    Period  Weeks    Status  On-going   100% this weekend, but not long enough to say the goal has been met.              Plan - 08/01/18 1238    Clinical Impression Statement  Pt played golf this past weekend with no pain. He continues to have no pain today. Today we concentrated on hip stabilization again, progressing pt in longer hold times, more reps.  Pt had no pain with any exercise during todays session, just visable muscle shaking. pt verbal reports his hip feels stronger.     Rehab Potential  Excellent    PT Frequency  2x / week    PT Duration  6 weeks    PT Treatment/Interventions  ADLs/Self Care Home Management;Biofeedback;Cryotherapy;Electrical Stimulation;Iontophoresis 13m/ml Dexamethasone;Moist Heat;Traction;Therapeutic activities;Therapeutic exercise;Patient/family education;Neuromuscular re-education;Manual techniques;Passive range of motion;Dry needling;Taping    PT Next Visit Plan  DN next, cont hip strength/stability    PT Home Exercise Plan   Access Code: [email or web address]     Consulted and Agree with Plan of Care  Patient       Patient will benefit from skilled therapeutic intervention in order to improve the following deficits and impairments:  Pain, Decreased strength, Increased muscle spasms  Visit Diagnosis: Pain in left hip  Stiffness of left hip, not elsewhere classified  Muscle weakness (generalized)  Cramp and spasm     Problem List There are no active problems to display for this patient.   Veroncia Jezek,  PTA 08/01/2018, 1:15 PM  [facility] [address] [place] [address]Phone: [phone]  Fax:  [phone] Name: Gary DueMRN: [id number] of Birth: 1May 02, 1987

## 2018-08-04 ENCOUNTER — Ambulatory Visit
Admission: RE | Admit: 2018-08-04 | Discharge: 2018-08-04 | Disposition: A | Discharge status: Home / Self Care | Payer: BLUE CROSS/BLUE SHIELD | Source: Ambulatory Visit | Attending: Family Medicine | Admitting: Family Medicine

## 2018-08-04 DIAGNOSIS — M25552 Pain in left hip: Secondary | ICD-10-CM

## 2018-08-04 DIAGNOSIS — R600 Localized edema: Secondary | ICD-10-CM | POA: Diagnosis not present

## 2018-08-05 ENCOUNTER — Encounter: Payer: Self-pay | Admitting: Physical Therapy

## 2018-08-05 ENCOUNTER — Ambulatory Visit: Payer: BLUE CROSS/BLUE SHIELD | Admitting: Physical Therapy

## 2018-08-05 DIAGNOSIS — R252 Cramp and spasm: Secondary | ICD-10-CM | POA: Diagnosis not present

## 2018-08-05 DIAGNOSIS — M6281 Muscle weakness (generalized): Secondary | ICD-10-CM

## 2018-08-05 DIAGNOSIS — M25552 Pain in left hip: Secondary | ICD-10-CM

## 2018-08-05 DIAGNOSIS — M25652 Stiffness of left hip, not elsewhere classified: Secondary | ICD-10-CM

## 2018-08-05 NOTE — Therapy (Signed)
[facility] [address] [place], [place], [address] Phone: [phone]   Fax:  [phone]  Physical Therapy Treatment  Patient Details  Name: Cope Marte MRN: [id number] Date of Birth: [birth year] Referring Provider: Lawerance Cruel, MD   Encounter Date: 08/05/2018  PT End of Session - 08/05/18 0908    Visit Number  11    Date for PT Re-Evaluation  08/12/18    PT Start Time  0846    PT Stop Time  0925   dry needling, heat   PT Time Calculation (min)  39 min    Activity Tolerance  Patient tolerated treatment well       History reviewed. No pertinent past medical history.  Past Surgical History:  Procedure Laterality Date  . meniscus surgery Rt knee      There were no vitals filed for this visit.  Subjective Assessment - 08/05/18 0848    Subjective  A little pain today 2/10.  I went to the gym but haven't stretched yet.  Had MRI yesterday but has not gotten the results yet.      Currently in Pain?  Yes    Pain Score  2     Pain Location  Hip    Pain Orientation  Left    Pain Type  Chronic pain                       [facility] Adult PT Treatment/Exercise - 08/05/18 0001      Moist Heat Therapy   Number Minutes Moist Heat  5 Minutes    Moist Heat Location  Hip      Electrical Stimulation   Electrical Stimulation Location  left quads    Electrical Stimulation Action  with DN    Electrical Stimulation Parameters  3 ma 15 min     Electrical Stimulation Goals  Pain      Manual Therapy   Soft tissue mobilization  left rectus femoris, vastus lateralis, ITB       Trigger Point Dry Needling - 08/05/18 4332    Consent Given?  Yes    Quadriceps Response  Twitch response elicited;Palpable increased muscle length             PT Short Term Goals - 08/01/18 1312      PT SHORT TERM GOAL #2   Title  Pt will report at least 20% less pain when playing disk golf and when waking up in  the morning    Time  3    Period  Weeks    Status  On-going   Played the first time this past weekend without pain, so some what early  to give a %.       PT Long Term Goals - 08/01/18 1312      PT LONG TERM GOAL #1   Title  Pt will be ind with advanced HEP    Time  6    Period  Weeks    Status  On-going      PT LONG TERM GOAL #3   Title  Pt will report ability to play disk golf confidently due to improved hip and core strength    Time  6    Period  Weeks    Status  On-going      PT LONG TERM GOAL #4   Title  Pt will report 75% reduction in pain overall    Time  6  Period  Weeks    Status  On-going   100% this weekend, but not long enough to say the goal has been met.            Plan - 08/05/18 0909    Clinical Impression Statement  The patient would like to continue with PT while he awaits results of his MRI.  He reports benefit from DN/ES in addition to hip strengthening.   He reports minimal to no pain while playing in a disc golf last weekend.  He reports less pain wearing different shoes with less tread on the bottom.  Therapist closely monitoring response to treatment.      Rehab Potential  Excellent    PT Frequency  2x / week    PT Duration  6 weeks    PT Treatment/Interventions  ADLs/Self Care Home Management;Biofeedback;Cryotherapy;Electrical Stimulation;Iontophoresis 15m/ml Dexamethasone;Moist Heat;Traction;Therapeutic activities;Therapeutic exercise;Patient/family education;Neuromuscular re-education;Manual techniques;Passive range of motion;Dry needling;Taping    PT Next Visit Plan  DN with ES;, cont hip strength/stability    PT Home Exercise Plan   Access Code: [email or web address]        Patient will benefit from skilled therapeutic intervention in order to improve the following deficits and impairments:  Pain, Decreased strength, Increased muscle spasms  Visit Diagnosis: Pain in left hip  Stiffness of left hip, not elsewhere classified  Muscle weakness  (generalized)  Cramp and spasm     Problem List There are no active problems to display for this patient.  SRuben Im PT 08/05/18 10:25 AM Phone: [phone]: [phone] SAlvera Singh9/13/2019, 10:24 AM  [facility] [address] [place] [address]Phone: [phone]  Fax:  [phone] Name: MMerick KelleherMRN: [id number] of Birth: [birth year]

## 2018-08-11 ENCOUNTER — Encounter: Payer: Self-pay | Admitting: Physical Therapy

## 2018-08-11 ENCOUNTER — Ambulatory Visit: Payer: BLUE CROSS/BLUE SHIELD | Admitting: Physical Therapy

## 2018-08-11 DIAGNOSIS — M25552 Pain in left hip: Secondary | ICD-10-CM

## 2018-08-11 DIAGNOSIS — M6281 Muscle weakness (generalized): Secondary | ICD-10-CM

## 2018-08-11 DIAGNOSIS — M25652 Stiffness of left hip, not elsewhere classified: Secondary | ICD-10-CM | POA: Diagnosis not present

## 2018-08-11 DIAGNOSIS — R252 Cramp and spasm: Secondary | ICD-10-CM | POA: Diagnosis not present

## 2018-08-11 NOTE — Therapy (Signed)
[facility] [address] [place], [place], [address] Phone: [phone]   Fax:  [phone]  Physical Therapy Treatment/Recertification   Patient Details  Name: Gary Mann MRN: [id number] Date of Birth: 07-02-1986 Referring Provider: Lawerance Cruel, MD   Encounter Date: 08/11/2018  PT End of Session - 08/11/18 0942    Visit Number  12    Date for PT Re-Evaluation  09/22/18    PT Start Time  0730    PT Stop Time  0805   30 min treatment session   PT Time Calculation (min)  35 min    Activity Tolerance  Patient tolerated treatment well       History reviewed. No pertinent past medical history.  Past Surgical History:  Procedure Laterality Date  . meniscus surgery Rt knee      There were no vitals filed for this visit.  Subjective Assessment - 08/11/18 0734    Subjective  Busy week.  No pain playing disc golf but I can make it hurt if I twist.  Ok with normal activities.  I'm annoyed that the doctor hasn't called me yet with the MRI results.      Diagnostic tests  MRI recently    Currently in Pain?  No/denies    Pain Score  0-No pain    Pain Location  Hip    Pain Orientation  Left    Pain Type  Chronic pain    Aggravating Factors   internal rotation and trunk rotation     Pain Relieving Factors  dry needling          [facility] PT Assessment - 08/11/18 0001      Observation/Other Assessments   Focus on Therapeutic Outcomes (FOTO)   21%      PROM   Overall PROM Comments  left hip IR 15% limited;  external rotation and other hip ROM all WNLs      Strength   Overall Strength Comments  LT hip abd 4+-5/5,                    [facility] Adult PT Treatment/Exercise - 08/11/18 0001      Electrical Stimulation   Electrical Stimulation Location  left quads    Electrical Stimulation Action  with DN    Electrical Stimulation Parameters  2.5 ma 10 min     Electrical Stimulation Goals  Pain       Manual Therapy   Soft tissue mobilization  left rectus femoris, vastus lateralis, ITB               PT Short Term Goals - 08/11/18 2035      PT SHORT TERM GOAL #1   Title  ind with initial HEP    Status  Achieved      PT SHORT TERM GOAL #2   Title  Pt will report at least 20% less pain when playing disk golf and when waking up in the morning    Status  Achieved        PT Long Term Goals - 08/11/18 0746      PT LONG TERM GOAL #1   Title  Pt will be ind with advanced HEP    Time  6    Period  Weeks    Status  On-going    Target Date  09/22/18      PT LONG TERM GOAL #2   Title  FOTO will be < or =  to 19% limited     Time  6    Period  Weeks    Status  On-going      PT LONG TERM GOAL #3   Title  Pt will report ability to play disk golf confidently due to improved hip and core strength    Time  6    Period  Weeks    Status  Partially Met      PT LONG TERM GOAL #4   Title  Pt will report 75% reduction in pain overall    Baseline  50%    Time  6    Period  Weeks    Status  Partially Met      PT LONG TERM GOAL #5   Title  Pt will be able to perform wide stance squat without increased pain    Status  --            Plan - 08/11/18 2022    Clinical Impression Statement  The patient reports his hip pain is about 50% better overall.  Recently he has been able to play disk golf with minimal pain.  Changing his shoes to a lighter tread shoe has been helpful.  His FOTO functional outcome score has improved to a 21% limitation.  He has full hip ROM except for 10% deficit in internal rotation.  Hip internal rotation and trunk rotation usually produces his pain.  His strength is grossly 4+/5 although he has difficulty stabilizing with intermediate and more advanced challenges.  He reports some benefit from DN and DN with electrical stimulation along with manual therapy.  He is awaiting results of his MRI.  Recommend 3-4 more visits for continued progression of a HEP and  further DN if needed.      PT Frequency  2x / week    PT Duration  6 weeks    PT Treatment/Interventions  ADLs/Self Care Home Management;Biofeedback;Cryotherapy;Electrical Stimulation;Iontophoresis '4mg'$ /ml Dexamethasone;Moist Heat;Traction;Therapeutic activities;Therapeutic exercise;Patient/family education;Neuromuscular re-education;Manual techniques;Passive range of motion;Dry needling;Taping    PT Next Visit Plan  DN with ES;, cont hip strength/stability    PT Home Exercise Plan   Access Code: [email or web address]        Patient will benefit from skilled therapeutic intervention in order to improve the following deficits and impairments:  Pain, Decreased strength, Increased muscle spasms  Visit Diagnosis: Pain in left hip - Plan: PT plan of care cert/re-cert  Stiffness of left hip, not elsewhere classified - Plan: PT plan of care cert/re-cert  Muscle weakness (generalized) - Plan: PT plan of care cert/re-cert  Cramp and spasm - Plan: PT plan of care cert/re-cert     Problem List There are no active problems to display for this patient.  Gary Mann, PT 08/11/18 8:44 PM Phone: [phone] Fax: [phone]  Gary Mann 08/11/2018, 8:43 PM  [facility] [address], [place], [place], [address] Phone: [phone]   Fax:  [phone]  Name: Gary Mann MRN: [id number] Date of Birth: [birth year]

## 2018-08-15 ENCOUNTER — Encounter

## 2018-08-16 DIAGNOSIS — M25552 Pain in left hip: Secondary | ICD-10-CM | POA: Diagnosis not present

## 2018-08-19 ENCOUNTER — Encounter: Payer: BLUE CROSS/BLUE SHIELD | Admitting: Physical Therapy

## 2018-09-02 ENCOUNTER — Ambulatory Visit: Payer: BLUE CROSS/BLUE SHIELD | Attending: Family Medicine | Admitting: Physical Therapy

## 2018-09-02 ENCOUNTER — Encounter: Payer: Self-pay | Admitting: Physical Therapy

## 2018-09-02 DIAGNOSIS — M6281 Muscle weakness (generalized): Secondary | ICD-10-CM | POA: Diagnosis not present

## 2018-09-02 DIAGNOSIS — R252 Cramp and spasm: Secondary | ICD-10-CM | POA: Diagnosis not present

## 2018-09-02 DIAGNOSIS — M25652 Stiffness of left hip, not elsewhere classified: Secondary | ICD-10-CM | POA: Diagnosis not present

## 2018-09-02 DIAGNOSIS — M25552 Pain in left hip: Secondary | ICD-10-CM | POA: Insufficient documentation

## 2018-09-02 NOTE — Therapy (Signed)
[facility] [address] [place], [place], [address] Phone: [phone]   Fax:  [phone]  Physical Therapy Treatment  Patient Details  Name: Gary Mann MRN: [id number] Date of Birth: 11-07-1986 Referring Provider (PT): Lawerance Cruel, MD   Encounter Date: 09/02/2018  PT End of Session - 09/02/18 1128    Visit Number  13    Date for PT Re-Evaluation  09/22/18    PT Start Time  0850    PT Stop Time  0940   dry needling, heat   PT Time Calculation (min)  50 min    Activity Tolerance  Patient tolerated treatment well       History reviewed. No pertinent past medical history.  Past Surgical History:  Procedure Laterality Date  . meniscus surgery Rt knee      There were no vitals filed for this visit.  Subjective Assessment - 09/02/18 0853    Subjective  Got MRI results and + for anterior labral tear.  He recommended injection or "live with it."  Plans to follow up with a 2nd opinion.      Limitations  Other (comment)    Diagnostic tests  MRI recently    Currently in Pain?  Yes    Pain Score  2     Pain Location  Hip    Pain Orientation  Left    Pain Type  Chronic pain    Pain Relieving Factors  dry needling helps for short term                        [facility] Adult PT Treatment/Exercise - 09/02/18 0001      Exercises   Other Exercises   discussed stabilization program used in study:"Nonsurgical Treatment of Acetabular Labrum Tears" focusing on lumbopelvic and hip stabilization       Electrical Stimulation   Electrical Stimulation Location  left quads    Electrical Stimulation Action  with DN    Electrical Stimulation Parameters  2.5 15 min     Electrical Stimulation Goals  Pain      Manual Therapy   Soft tissue mobilization  left rectus femoris, vastus lateralis, ITB               PT Short Term Goals - 08/11/18 2035      PT SHORT TERM GOAL #1   Title  ind with  initial HEP    Status  Achieved      PT SHORT TERM GOAL #2   Title  Pt will report at least 20% less pain when playing disk golf and when waking up in the morning    Status  Achieved        PT Long Term Goals - 08/11/18 0746      PT LONG TERM GOAL #1   Title  Pt will be ind with advanced HEP    Time  6    Period  Weeks    Status  On-going    Target Date  09/22/18      PT LONG TERM GOAL #2   Title  FOTO will be < or = to 19% limited     Time  6    Period  Weeks    Status  On-going      PT LONG TERM GOAL #3   Title  Pt will report ability to play disk golf confidently due to improved hip and core strength  Time  6    Period  Weeks    Status  Partially Met      PT LONG TERM GOAL #4   Title  Pt will report 75% reduction in pain overall    Baseline  50%    Time  6    Period  Weeks    Status  Partially Met      PT LONG TERM GOAL #5   Title  Pt will be able to perform wide stance squat without increased pain    Status  --            Plan - 09/02/18 1132    Clinical Impression Statement  Extensive discussion on conservative management of hip labral tears.  The patient reports his pain continues to be mild and he feels dry needling continues to be effective for pain relief.  He continues to ex at the gym regularly and we discussed specific lumbopelvic and hip stabilization focus.  Fewer tender points noted overall but still present lateral quads and ITB.  Therapist closely monitoring response with all treatment interventions.      Rehab Potential  Excellent    PT Frequency  2x / week    PT Duration  6 weeks    PT Treatment/Interventions  ADLs/Self Care Home Management;Biofeedback;Cryotherapy;Electrical Stimulation;Iontophoresis '4mg'$ /ml Dexamethasone;Moist Heat;Traction;Therapeutic activities;Therapeutic exercise;Patient/family education;Neuromuscular re-education;Manual techniques;Passive range of motion;Dry needling;Taping    PT Next Visit Plan  DN with ES;, cont hip  strength/stability    PT Home Exercise Plan   Access Code: [email or web address]        Patient will benefit from skilled therapeutic intervention in order to improve the following deficits and impairments:  Pain, Decreased strength, Increased muscle spasms  Visit Diagnosis: Pain in left hip  Stiffness of left hip, not elsewhere classified  Muscle weakness (generalized)  Cramp and spasm     Problem List There are no active problems to display for this patient.  Ruben Im, PT 09/02/18 11:47 AM Phone: [phone] Fax: [phone] [name] 09/02/2018, 11:46 AM  [facility] [address] [place], [place], [address] Phone: [phone]   Fax:  [phone]  Name: Gary Mann MRN: [id number] Date of Birth: 1985/12/25

## 2018-09-07 ENCOUNTER — Encounter: Payer: Self-pay | Admitting: Physical Therapy

## 2018-09-07 ENCOUNTER — Ambulatory Visit: Payer: BLUE CROSS/BLUE SHIELD | Admitting: Physical Therapy

## 2018-09-07 DIAGNOSIS — R252 Cramp and spasm: Secondary | ICD-10-CM

## 2018-09-07 DIAGNOSIS — M25652 Stiffness of left hip, not elsewhere classified: Secondary | ICD-10-CM | POA: Diagnosis not present

## 2018-09-07 DIAGNOSIS — M25552 Pain in left hip: Secondary | ICD-10-CM | POA: Diagnosis not present

## 2018-09-07 DIAGNOSIS — M6281 Muscle weakness (generalized): Secondary | ICD-10-CM

## 2018-09-07 NOTE — Therapy (Signed)
[facility] [address] [place], [place], [address] Phone: [phone]   Fax:  [phone]  Physical Therapy Treatment  Patient Details  Name: Gary Mann MRN: [id number] Date of Birth: 10/08/1986 Referring Provider (PT): Lawerance Cruel, MD   Encounter Date: 09/07/2018  PT End of Session - 09/07/18 0935    Visit Number  14    Date for PT Re-Evaluation  09/22/18    PT Start Time  0933    PT Stop Time  1011    PT Time Calculation (min)  38 min    Activity Tolerance  Patient tolerated treatment well    Behavior During Therapy  [facility] for tasks assessed/performed       History reviewed. No pertinent past medical history.  Past Surgical History:  Procedure Laterality Date  . meniscus surgery Rt knee      There were no vitals filed for this visit.  Subjective Assessment - 09/07/18 0936    Subjective  Did not have any benefit from needling last session. HAd some increased pain yesterday, but today is better.    Currently in Pain?  Yes    Pain Score  3     Pain Orientation  Left    Aggravating Factors   IR of hip    Pain Relieving Factors  Dry needling can help, exercise can help                        [facility] Adult PT Treatment/Exercise - 09/07/18 0001      Knee/Hip Exercises: Stretches   Other Knee/Hip Stretches  Hamstring stretch bil 30 sec 2x       Knee/Hip Exercises: Aerobic   Stationary Bike  L6 x 10 min   PTA present for current status     Knee/Hip Exercises: Standing   Other Standing Knee Exercises  BOSU upside down static squats 3x 1 min   Min VC for LE alignement.   Other Standing Knee Exercises  Black pod Warrior 3 2x 20 sec      Knee/Hip Exercises: Supine   Bridges  --   feet on ball 10x 5 sec: roll ball in bridge 10x              PT Short Term Goals - 08/11/18 2035      PT SHORT TERM GOAL #1   Title  ind with initial HEP    Status  Achieved      PT SHORT  TERM GOAL #2   Title  Pt will report at least 20% less pain when playing disk golf and when waking up in the morning    Status  Achieved        PT Long Term Goals - 08/11/18 0746      PT LONG TERM GOAL #1   Title  Pt will be ind with advanced HEP    Time  6    Period  Weeks    Status  On-going    Target Date  09/22/18      PT LONG TERM GOAL #2   Title  FOTO will be < or = to 19% limited     Time  6    Period  Weeks    Status  On-going      PT LONG TERM GOAL #3   Title  Pt will report ability to play disk golf confidently due to improved hip and core strength  Time  6    Period  Weeks    Status  Partially Met      PT LONG TERM GOAL #4   Title  Pt will report 75% reduction in pain overall    Baseline  50%    Time  6    Period  Weeks    Status  Partially Met      PT LONG TERM GOAL #5   Title  Pt will be able to perform wide stance squat without increased pain    Status  --            Plan - 09/07/18 0935    Clinical Impression Statement  Pt presents with his baseline hip pain. Todays session concentrated on hip stabilzation exercises which he reports he has been incorporating more into his gym routine ( previously he only did machines) and consequently feels like he is able to utilize his glutes more effectivlely and maybe it is helping to reduce his pain frequency. LTLE very visably shakey with more complex exercises like bridging on a physioball or Warrior 3 on a noncompliant surface.     Rehab Potential  Excellent    PT Frequency  2x / week    PT Duration  6 weeks    PT Treatment/Interventions  ADLs/Self Care Home Management;Biofeedback;Cryotherapy;Electrical Stimulation;Iontophoresis 62m/ml Dexamethasone;Moist Heat;Traction;Therapeutic activities;Therapeutic exercise;Patient/family education;Neuromuscular re-education;Manual techniques;Passive range of motion;Dry needling;Taping    PT Next Visit Plan  DN with ES;, cont hip strength/stability    PT Home Exercise  Plan   Access Code: [email or web address]     Consulted and Agree with Plan of Care  Patient       Patient will benefit from skilled therapeutic intervention in order to improve the following deficits and impairments:  Pain, Decreased strength, Increased muscle spasms  Visit Diagnosis: Pain in left hip  Stiffness of left hip, not elsewhere classified  Muscle weakness (generalized)  Cramp and spasm     Problem List There are no active problems to display for this patient.   Richard Holz, PTA 09/07/2018, 10:16 AM  [facility] [address] [place] [address]Phone: [phone]  Fax:  [phone] Name: Gary ZindaMRN: [id number] of Birth: 127-Feb-1987

## 2018-09-08 ENCOUNTER — Encounter: Payer: Self-pay | Admitting: Physical Therapy

## 2018-09-08 ENCOUNTER — Ambulatory Visit: Payer: BLUE CROSS/BLUE SHIELD | Admitting: Physical Therapy

## 2018-09-08 DIAGNOSIS — M25552 Pain in left hip: Secondary | ICD-10-CM | POA: Diagnosis not present

## 2018-09-08 DIAGNOSIS — R252 Cramp and spasm: Secondary | ICD-10-CM | POA: Diagnosis not present

## 2018-09-08 DIAGNOSIS — M6281 Muscle weakness (generalized): Secondary | ICD-10-CM | POA: Diagnosis not present

## 2018-09-08 DIAGNOSIS — M25652 Stiffness of left hip, not elsewhere classified: Secondary | ICD-10-CM | POA: Diagnosis not present

## 2018-09-08 NOTE — Therapy (Signed)
[facility] [address], [place], [place], [address] Phone: [phone]   Fax:  [phone]  Physical Therapy Treatment  Patient Details  Name: Gary Mann MRN: [id number] Date of Birth: 06/27/1986 Referring Provider (PT): Lawerance Cruel, MD   Encounter Date: 09/08/2018  PT End of Session - 09/08/18 1619    Visit Number  15    Date for PT Re-Evaluation  09/22/18    PT Start Time  2841    PT Stop Time  3244    PT Time Calculation (min)  46 min    Activity Tolerance  Patient tolerated treatment well       History reviewed. No pertinent past medical history.  Past Surgical History:  Procedure Laterality Date  . meniscus surgery Rt knee      There were no vitals filed for this visit.  Subjective Assessment - 09/08/18 0931    Subjective  Monday and Tuesday was horrible 4/10 but then Wed and today are OK.  Maybe from lack of activity.  Internal rotation hurts.  Awaiting call back about 2nd opinion.  Considering PRP injections.  "I feel like if I could just pop that hip it would feel better."      Limitations  Other (comment)    Diagnostic tests  MRI recently    Currently in Pain?  No/denies    Pain Score  0-No pain                       [facility] Adult PT Treatment/Exercise - 09/08/18 0001      Exercises   Other Exercises   discussed home and gym program and avoidance of aggravating internal rotation with SLR      Knee/Hip Exercises: Standing   Other Standing Knee Exercises  leaning forward on table with resisted hip extension       Electrical Stimulation   Electrical Stimulation Location  left quads    Electrical Stimulation Action  with DN    Electrical Stimulation Parameters  3 ma 15 min     Electrical Stimulation Goals  Pain      Manual Therapy   Soft tissue mobilization  left rectus femoris, vastus lateralis, sartorius       Trigger Point Dry Needling - 09/08/18 1015    Consent Given?  Yes    Quadriceps Response  Twitch response elicited;Palpable increased muscle length             PT Short Term Goals - 08/11/18 2035      PT SHORT TERM GOAL #1   Title  ind with initial HEP    Status  Achieved      PT SHORT TERM GOAL #2   Title  Pt will report at least 20% less pain when playing disk golf and when waking up in the morning    Status  Achieved        PT Long Term Goals - 08/11/18 0746      PT LONG TERM GOAL #1   Title  Pt will be ind with advanced HEP    Time  6    Period  Weeks    Status  On-going    Target Date  09/22/18      PT LONG TERM GOAL #2   Title  FOTO will be < or = to 19% limited     Time  6    Period  Weeks    Status  On-going      PT LONG TERM GOAL #3   Title  Pt will report ability to play disk golf confidently due to improved hip and core strength    Time  6    Period  Weeks    Status  Partially Met      PT LONG TERM GOAL #4   Title  Pt will report 75% reduction in pain overall    Baseline  50%    Time  6    Period  Weeks    Status  Partially Met      PT LONG TERM GOAL #5   Title  Pt will be able to perform wide stance squat without increased pain    Status  --            Plan - 09/08/18 1609    Clinical Impression Statement  The patient reports 2 bad days followed by 2 pretty good days.  He associates the bad days with inactivity over the weekend.  SLR with internal rotation is his most painful motion.  Discussed not pushing forcing internal rotation or mobilizing/manipulating the hip as this may further exacerbate symptoms.  Encouraged continuation of hip stabilization ex program.  He may be approaching max benefit from dry needling.   Competing in disc golf tournaments the next 2 weekends.  Will continue to promote independence in HEP and anticipate readiness for discharge from PT in 2-3 weeks.      Rehab Potential  Excellent    PT Frequency  2x / week    PT Duration  6 weeks    PT  Treatment/Interventions  ADLs/Self Care Home Management;Biofeedback;Cryotherapy;Electrical Stimulation;Iontophoresis '4mg'$ /ml Dexamethasone;Moist Heat;Traction;Therapeutic activities;Therapeutic exercise;Patient/family education;Neuromuscular re-education;Manual techniques;Passive range of motion;Dry needling;Taping    PT Next Visit Plan  DN with ES;, cont hip strength/stability    PT Home Exercise Plan   Access Code: [email or web address]        Patient will benefit from skilled therapeutic intervention in order to improve the following deficits and impairments:  Pain, Decreased strength, Increased muscle spasms  Visit Diagnosis: Pain in left hip  Stiffness of left hip, not elsewhere classified  Muscle weakness (generalized)  Cramp and spasm     Problem List There are no active problems to display for this patient.  Ruben Im, PT 09/08/18 4:24 PM Phone: [phone] Fax: [phone]  [name] 09/08/2018, 4:23 PM  [facility] [address] [place], [place], [address] Phone: [phone]   Fax:  [phone]  Name: Gary Mann MRN: [id number] Date of Birth: 1986/11/12

## 2018-09-09 ENCOUNTER — Ambulatory Visit: Payer: BLUE CROSS/BLUE SHIELD | Admitting: Physical Therapy

## 2018-09-12 ENCOUNTER — Ambulatory Visit: Payer: BLUE CROSS/BLUE SHIELD | Admitting: Physical Therapy

## 2018-09-12 ENCOUNTER — Encounter: Payer: Self-pay | Admitting: Physical Therapy

## 2018-09-12 DIAGNOSIS — M25652 Stiffness of left hip, not elsewhere classified: Secondary | ICD-10-CM

## 2018-09-12 DIAGNOSIS — R252 Cramp and spasm: Secondary | ICD-10-CM

## 2018-09-12 DIAGNOSIS — M25552 Pain in left hip: Secondary | ICD-10-CM

## 2018-09-12 DIAGNOSIS — M6281 Muscle weakness (generalized): Secondary | ICD-10-CM | POA: Diagnosis not present

## 2018-09-12 NOTE — Therapy (Signed)
[facility] [address], [place], [place], [address] Phone: [phone]   Fax:  [phone]  Physical Therapy Treatment  Patient Details  Name: Gary Mann MRN: [id number] Date of Birth: 19-Feb-1986 Referring Provider (PT): Lawerance Cruel, MD   Encounter Date: 09/12/2018  PT End of Session - 09/12/18 0848    Visit Number  16    Date for PT Re-Evaluation  09/22/18    PT Start Time  0175    PT Stop Time  0925    PT Time Calculation (min)  38 min    Activity Tolerance  Patient tolerated treatment well    Behavior During Therapy  [facility] for tasks assessed/performed       History reviewed. No pertinent past medical history.  Past Surgical History:  Procedure Laterality Date  . meniscus surgery Rt knee      There were no vitals filed for this visit.  Subjective Assessment - 09/12/18 0851    Subjective  I was fine over the weekend. Just a little bit of pain/soreness is there now.     Currently in Pain?  Yes    Pain Score  1     Pain Location  Hip    Pain Orientation  Left    Pain Descriptors / Indicators  Dull    Aggravating Factors   IR of hip    Pain Relieving Factors  Dry needling/exercises    Multiple Pain Sites  No                       [facility] Adult PT Treatment/Exercise - 09/12/18 0001      Knee/Hip Exercises: Stretches   Other Knee/Hip Stretches  Hamstring stretch with strap Bil 1x 1 min      Knee/Hip Exercises: Aerobic   Stationary Bike  L6 x 10 min   PTA present for current status     Knee/Hip Exercises: Standing   Other Standing Knee Exercises  BOSU upside down static squats 3x 1 min   Min VC for LE alignement.   Other Standing Knee Exercises  SLS on black pad with plyo toss red 2x 20       Knee/Hip Exercises: Supine   Bridges  Strengthening;Both;1 set;10 reps   Feet on ball: then single leg ( LT) bridge 10x slowly              PT Short Term Goals - 08/11/18  2035      PT SHORT TERM GOAL #1   Title  ind with initial HEP    Status  Achieved      PT SHORT TERM GOAL #2   Title  Pt will report at least 20% less pain when playing disk golf and when waking up in the morning    Status  Achieved        PT Long Term Goals - 08/11/18 0746      PT LONG TERM GOAL #1   Title  Pt will be ind with advanced HEP    Time  6    Period  Weeks    Status  On-going    Target Date  09/22/18      PT LONG TERM GOAL #2   Title  FOTO will be < or = to 19% limited     Time  6    Period  Weeks    Status  On-going      PT LONG TERM GOAL #  3   Title  Pt will report ability to play disk golf confidently due to improved hip and core strength    Time  6    Period  Weeks    Status  Partially Met      PT LONG TERM GOAL #4   Title  Pt will report 75% reduction in pain overall    Baseline  50%    Time  6    Period  Weeks    Status  Partially Met      PT LONG TERM GOAL #5   Title  Pt will be able to perform wide stance squat without increased pain    Status  --            Plan - 09/12/18 0848    Clinical Impression Statement  Pt reports no pain/symptoms over the weekend. Pt conitues to be challenged in his LT hip musculature performing his stabilization exercises in the clinic today. Trial and perormance of single leg bridging with the LT LE. Some hamstring cramping that needing stretching.      Rehab Potential  Excellent    PT Frequency  2x / week    PT Duration  6 weeks    PT Treatment/Interventions  ADLs/Self Care Home Management;Biofeedback;Cryotherapy;Electrical Stimulation;Iontophoresis '4mg'$ /ml Dexamethasone;Moist Heat;Traction;Therapeutic activities;Therapeutic exercise;Patient/family education;Neuromuscular re-education;Manual techniques;Passive range of motion;Dry needling;Taping    PT Next Visit Plan  DN if seen by PT, otherwise continue with stabilization.    PT Home Exercise Plan   Access Code: [email or web address]     Consulted and Agree with Plan of  Care  Patient       Patient will benefit from skilled therapeutic intervention in order to improve the following deficits and impairments:  Pain, Decreased strength, Increased muscle spasms  Visit Diagnosis: Pain in left hip  Stiffness of left hip, not elsewhere classified  Muscle weakness (generalized)  Cramp and spasm     Problem List There are no active problems to display for this patient.   COCHRAN,JENNIFER, PTA 09/12/2018, 9:25 AM  [facility] [address] [place], [place], [address] Phone: [phone]   Fax:  [phone]  Name: Gary Mann MRN: [id number] Date of Birth: 1985/12/31

## 2018-09-16 ENCOUNTER — Encounter: Payer: Self-pay | Admitting: Physical Therapy

## 2018-09-16 ENCOUNTER — Ambulatory Visit: Payer: BLUE CROSS/BLUE SHIELD | Admitting: Physical Therapy

## 2018-09-16 DIAGNOSIS — R252 Cramp and spasm: Secondary | ICD-10-CM

## 2018-09-16 DIAGNOSIS — M6281 Muscle weakness (generalized): Secondary | ICD-10-CM | POA: Diagnosis not present

## 2018-09-16 DIAGNOSIS — M25652 Stiffness of left hip, not elsewhere classified: Secondary | ICD-10-CM

## 2018-09-16 DIAGNOSIS — M25552 Pain in left hip: Secondary | ICD-10-CM | POA: Diagnosis not present

## 2018-09-16 NOTE — Therapy (Signed)
[facility] [address] [place], [place], [address] Phone: [phone]   Fax:  [phone]  Physical Therapy Treatment  Patient Details  Name: Gary Mann MRN: [id number] Date of Birth: 10/28/1986 Referring Provider (PT): Daisy Floro, MD   Encounter Date: 09/16/2018  PT End of Session - 09/16/18 0939    Visit Number  17    Date for PT Re-Evaluation  09/22/18    PT Start Time  0939   Pt was 10 min late   PT Stop Time  1012    PT Time Calculation (min)  33 min    Activity Tolerance  Patient tolerated treatment well    Behavior During Therapy  [facility] for tasks assessed/performed       History reviewed. No pertinent past medical history.  Past Surgical History:  Procedure Laterality Date  . meniscus surgery Rt knee      There were no vitals filed for this visit.  Subjective Assessment - 09/16/18 0940    Subjective  Doing well. The exercises do not excaerbate the pain. Overall pain down to 1/10 fairly consistently.    Currently in Pain?  Yes    Pain Score  1     Pain Location  Hip    Pain Orientation  Left    Pain Descriptors / Indicators  Dull    Multiple Pain Sites  No                       [facility] Adult PT Treatment/Exercise - 09/16/18 0001      Knee/Hip Exercises: Stretches   Other Knee/Hip Stretches  Hamstring stretch with strap Bil 1x 1 min    Other Knee/Hip Stretches  quad stretching 3x 30 sec Bil      Knee/Hip Exercises: Aerobic   Stationary Bike  L6 x 10 min   PTA present for current status/goals     Knee/Hip Exercises: Standing   Other Standing Knee Exercises  BOSU upside down static squats 3x 1 min   5# biceps curls, chest press then shoulder press concurrent     Knee/Hip Exercises: Supine   Bridges  --   Feet on roll 10x, single LTLE 2x6     Knee/Hip Exercises: Sidelying   Clams  Hip abd 0#10x, 2# added 10x   gave pt yellow band so he can do resistive hip abd at  home            PT Education - 09/16/18 1014    Education Details  Hip abduction for HEP: yellow band given but pt has some ankle weights he can also use at home.     Person(s) Educated  Patient    Methods  Explanation;Demonstration;Tactile cues;Verbal cues;Handout    Comprehension  Returned demonstration;Verbalized understanding       PT Short Term Goals - 08/11/18 2035      PT SHORT TERM GOAL #1   Title  ind with initial HEP    Status  Achieved      PT SHORT TERM GOAL #2   Title  Pt will report at least 20% less pain when playing disk golf and when waking up in the morning    Status  Achieved        PT Long Term Goals - 09/16/18 0941      PT LONG TERM GOAL #1   Title  Pt will be ind with advanced HEP    Time  6  Period  Weeks    Status  On-going      PT LONG TERM GOAL #4   Title  Pt will report 75% reduction in pain overall    Time  6    Period  Weeks    Status  --   25%-50% in general.           Plan - 09/16/18 0940    Clinical Impression Statement  Added weights with upper body movements to pt's BOSU squats. This increased challege tremendously, pt will be able to simulate this at the gym. LE again visably shaking in fact, pt needed short rest break at 30 sec from quad & glute fatigue.  Added  hip abduction to HEP today.  pt required VC/TC for organizing his body in order to do the exercise correctly.     Rehab Potential  Excellent    PT Frequency  2x / week    PT Duration  6 weeks    PT Treatment/Interventions  ADLs/Self Care Home Management;Biofeedback;Cryotherapy;Electrical Stimulation;Iontophoresis 4mg /ml Dexamethasone;Moist Heat;Traction;Therapeutic activities;Therapeutic exercise;Patient/family education;Neuromuscular re-education;Manual techniques;Passive range of motion;Dry needling;Taping    PT Next Visit Plan  DC next week.    PT Home Exercise Plan   Access Code: [email or web address]     Consulted and Agree with Plan of Care  Patient       Patient  will benefit from skilled therapeutic intervention in order to improve the following deficits and impairments:  Pain, Decreased strength, Increased muscle spasms  Visit Diagnosis: Pain in left hip  Stiffness of left hip, not elsewhere classified  Muscle weakness (generalized)  Cramp and spasm     Problem List There are no active problems to display for this patient.   Trustin Chapa, PTA 09/16/2018, 10:18 AM  [facility] [address] [place], [place], [address] Phone: [phone]   Fax:  [phone]  Name: Gary Mann MRN: [id number] Date of Birth: [birth year]

## 2018-09-19 ENCOUNTER — Ambulatory Visit: Payer: BLUE CROSS/BLUE SHIELD | Admitting: Physical Therapy

## 2018-09-19 ENCOUNTER — Encounter: Payer: Self-pay | Admitting: Physical Therapy

## 2018-09-19 DIAGNOSIS — M25652 Stiffness of left hip, not elsewhere classified: Secondary | ICD-10-CM

## 2018-09-19 DIAGNOSIS — M25552 Pain in left hip: Secondary | ICD-10-CM | POA: Diagnosis not present

## 2018-09-19 DIAGNOSIS — R252 Cramp and spasm: Secondary | ICD-10-CM

## 2018-09-19 DIAGNOSIS — M6281 Muscle weakness (generalized): Secondary | ICD-10-CM

## 2018-09-19 NOTE — Therapy (Signed)
[facility] [address], [address] [place], [place], [address] Phone: [phone]   Fax:  [phone]  Physical Therapy Treatment  Patient Details  Name: Gary Mann MRN: [id number] Date of Birth: 1986/09/09 Referring Provider (PT): Daisy Floro, MD   Encounter Date: 09/19/2018  PT End of Session - 09/19/18 0848    Visit Number  18    Date for PT Re-Evaluation  09/22/18    PT Start Time  0847    PT Stop Time  0924    PT Time Calculation (min)  37 min    Activity Tolerance  Patient tolerated treatment well    Behavior During Therapy  [facility] for tasks assessed/performed       History reviewed. No pertinent past medical history.  Past Surgical History:  Procedure Laterality Date  . meniscus surgery Rt knee      There were no vitals filed for this visit.  Subjective Assessment - 09/19/18 0851    Subjective  Played in tournament this weekend, had no pain. This AM continues to report no pain.     Currently in Pain?  No/denies    Multiple Pain Sites  No                       [facility] Adult PT Treatment/Exercise - 09/19/18 0001      Knee/Hip Exercises: Stretches   Other Knee/Hip Stretches  Hamstring stretch with strap Bil 1x 1 min    Other Knee/Hip Stretches  quad stretching 3x 30 sec Bil      Knee/Hip Exercises: Aerobic   Stationary Bike  L6 x 10 min   PTA present for current status     Knee/Hip Exercises: Standing   SLS  On black pad with red plyotoss 2x 20    Other Standing Knee Exercises  BOSU upside down static squats 3x 20    5# biceps curls, chest press then shoulder press concurrent   Other Standing Knee Exercises  review of Warrior 3 and revolving Triangle for HEP   VC for > hip facilitation as he decreases support of UE     Knee/Hip Exercises: Sidelying   Clams  Hip ABDUCTION 2# 3x10 , gave pt red band for home progression when he feels yellow is too easy.               PT  Short Term Goals - 08/11/18 2035      PT SHORT TERM GOAL #1   Title  ind with initial HEP    Status  Achieved      PT SHORT TERM GOAL #2   Title  Pt will report at least 20% less pain when playing disk golf and when waking up in the morning    Status  Achieved        PT Long Term Goals - 09/19/18 0904      PT LONG TERM GOAL #3   Title  Pt will report ability to play disk golf confidently due to improved hip and core strength    Time  6    Period  Weeks    Status  Achieved      PT LONG TERM GOAL #5   Title  Pt will be able to perform wide stance squat without increased pain    Time  6    Period  Weeks    Status  Achieved  Plan - 09/19/18 0849    Clinical Impression Statement  Pt has not had any pain playing disc golf since August per his report today. He is independent in his HEP that includes LT hip stabilization and flexibility exercises. He was able to increase his resistance for hip abduction today . He reports he is ready for DC at his next session.     Rehab Potential  Excellent    PT Frequency  2x / week    PT Duration  6 weeks    PT Treatment/Interventions  ADLs/Self Care Home Management;Biofeedback;Cryotherapy;Electrical Stimulation;Iontophoresis 4mg /ml Dexamethasone;Moist Heat;Traction;Therapeutic activities;Therapeutic exercise;Patient/family education;Neuromuscular re-education;Manual techniques;Passive range of motion;Dry needling;Taping    PT Next Visit Plan  DC next visit    PT Home Exercise Plan   Access Code: [email or web address]     Consulted and Agree with Plan of Care  Patient       Patient will benefit from skilled therapeutic intervention in order to improve the following deficits and impairments:  Pain, Decreased strength, Increased muscle spasms  Visit Diagnosis: Pain in left hip  Stiffness of left hip, not elsewhere classified  Muscle weakness (generalized)  Cramp and spasm     Problem List There are no active problems to display for  this patient.   Merril Nagy, PTA 09/19/2018, 9:25 AM  [facility] [address] [place], [place], [address] Phone: [phone]   Fax:  [phone]  Name: Gary Mann MRN: [id number] Date of Birth: [birth year]

## 2018-09-20 DIAGNOSIS — F988 Other specified behavioral and emotional disorders with onset usually occurring in childhood and adolescence: Secondary | ICD-10-CM | POA: Diagnosis not present

## 2018-09-22 ENCOUNTER — Ambulatory Visit: Payer: BLUE CROSS/BLUE SHIELD | Admitting: Physical Therapy

## 2018-09-22 ENCOUNTER — Encounter: Payer: Self-pay | Admitting: Physical Therapy

## 2018-09-22 DIAGNOSIS — M6281 Muscle weakness (generalized): Secondary | ICD-10-CM | POA: Diagnosis not present

## 2018-09-22 DIAGNOSIS — M25552 Pain in left hip: Secondary | ICD-10-CM | POA: Diagnosis not present

## 2018-09-22 DIAGNOSIS — R252 Cramp and spasm: Secondary | ICD-10-CM | POA: Diagnosis not present

## 2018-09-22 DIAGNOSIS — M25652 Stiffness of left hip, not elsewhere classified: Secondary | ICD-10-CM

## 2018-09-22 NOTE — Therapy (Signed)
[facility] [address], [place], [place], [address] Phone: [phone]   Fax:  [phone]  Physical Therapy Treatment/Discharge Summary   Patient Details  Name: Gary Mann MRN: [id number] Date of Birth: 1986-10-29 Referring Provider (PT): Lawerance Cruel, MD   Encounter Date: 09/22/2018  PT End of Session - 09/22/18 1803    Visit Number  19    Date for PT Re-Evaluation  09/22/18    PT Start Time  0845    PT Stop Time  0920    PT Time Calculation (min)  35 min    Activity Tolerance  Patient tolerated treatment well       History reviewed. No pertinent past medical history.  Past Surgical History:  Procedure Laterality Date  . meniscus surgery Rt knee      There were no vitals filed for this visit.  Subjective Assessment - 09/22/18 0847    Subjective  I'm playing disc golf without pain.  I can manipulate it and make it hurt but not with regular ADLs.  I think I'm ready to be discharged.      Diagnostic tests  MRI recently    Currently in Pain?  No/denies    Pain Score  0-No pain         [facility] PT Assessment - 09/22/18 0001      Observation/Other Assessments   Focus on Therapeutic Outcomes (FOTO)   17% limitation       AROM   Overall AROM Comments  right hip internal 28, exteral 70;  left 35 internal, 50 external rotation       PROM   Overall PROM Comments  Left internal rotation symmetrical with right;         Strength   Overall Strength Comments  5/5 left hip strength                    [facility] Adult PT Treatment/Exercise - 09/22/18 0001      Exercises   Other Exercises   review of comprehensive HEP              PT Education - 09/22/18 1802    Education Details   Access Code: [email or web address]     Person(s) Educated  Patient    Methods  Demonstration;Explanation;Handout    Comprehension  Returned demonstration;Verbalized understanding       PT Short Term Goals -  09/22/18 2125      PT SHORT TERM GOAL #1   Title  ind with initial HEP    Status  Achieved      PT SHORT TERM GOAL #2   Title  Pt will report at least 20% less pain when playing disk golf and when waking up in the morning    Status  Achieved        PT Long Term Goals - 09/22/18 2125      PT LONG TERM GOAL #1   Title  Pt will be ind with advanced HEP    Status  Achieved      PT LONG TERM GOAL #2   Title  FOTO will be < or = to 19% limited     Status  Achieved      PT LONG TERM GOAL #3   Title  Pt will report ability to play disk golf confidently due to improved hip and core strength    Status  Achieved      PT LONG TERM GOAL #4  Title  Pt will report 75% reduction in pain overall    Status  Achieved      PT LONG TERM GOAL #5   Title  Pt will be able to perform wide stance squat without increased pain    Status  Achieved            Plan - 09/22/18 0905    Clinical Impression Statement  The patient reports no hip pain during ADLS or while playing disc golf in the last month.  He has full ROM in his hip and good overall strength.  He has benefited from manual therapy, dry needling, electrical stimulation  and therapeutic exercise.   He has been instructed in a comprehensive HEP for further stabilization of core and hip muscles.  He has met all rehab goals.  Will discharge from PT at this time.      Rehab Potential  Excellent    PT Frequency  2x / week    PT Duration  6 weeks    PT Treatment/Interventions  ADLs/Self Care Home Management;Biofeedback;Cryotherapy;Electrical Stimulation;Iontophoresis '4mg'$ /ml Dexamethasone;Moist Heat;Traction;Therapeutic activities;Therapeutic exercise;Patient/family education;Neuromuscular re-education;Manual techniques;Passive range of motion;Dry needling;Taping    PT Home Exercise Plan   Access Code: [email or web address]        Patient will benefit from skilled therapeutic intervention in order to improve the following deficits and impairments:   Pain, Decreased strength, Increased muscle spasms  Visit Diagnosis: Pain in left hip  Stiffness of left hip, not elsewhere classified  Muscle weakness (generalized)  Cramp and spasm     Problem List There are no active problems to display for this patient.  Ruben Im, PT 09/22/18 9:27 PM Phone: [phone] Fax: [phone]  [name] 09/22/2018, 9:26 PM  [facility] [address] [place], [place], [address] Phone: [phone]   Fax:  [phone]  Name: Gary Mann MRN: [id number] Date of Birth: 1986/09/15

## 2018-09-22 NOTE — Patient Instructions (Signed)
Access Code: [email or web address]  URL: [email or web address]  Date: 09/22/2018  Prepared by: Lavinia Sharps   Exercises  Standing ITB Stretch - 10 reps - 3 sets - 1x daily - 7x weekly  Supine Bridge - 10 reps - 3 sets - 1x daily - 7x weekly  Seated Heel Squeeze - 10 reps - 3 sets - 1x daily - 7x weekly  Quadruped Leg Extension with Resistance - 20 reps - 1 sets - 1x daily - 7x weekly  Squat on BOSU Ball - 10 reps - 1 sets - 1x daily - 7x weekly  Quadruped Hip Abduction with Resistance Loop - 10 reps - 3 sets - 1x daily - 7x weekly  Quadruped Hip Abduction with Resistance Loop - 20 reps - 1 sets - 1x daily - 7x weekly  Standard Plank - 10 reps - 1 sets - 1x daily - 7x weekly  Warrior II - 2 reps - 2 sets - 4 hold - 1x daily - 7x weekly  Side Plank on Knees - 10 reps - 1 sets - 1x daily - 7x weekly  Standing Single Leg Ball Rolling in Circles (BKA) - 10 reps - 1 sets - 1x daily - 7x weekly  Single Leg Stance on Foam Pad - 10 reps - 1 sets - 1x daily - 7x weekly

## 2018-11-03 IMAGING — MR MR HIP*L* W/O CM
4 of 5 series · 29 of 40 positions shown · non-contrast
Comparison: None.

CLINICAL DATA: Left hip pain. Left hip flexor pain with popping.
Painful range of motion.

EXAM:
MR OF THE LEFT HIP WITHOUT CONTRAST
TECHNIQUE: Multiplanar, multisequence MR imaging was performed. No intravenous
contrast was administered.

[Series 5: T2 fat-sat · coronal · 4.0mm · 0.74mm/px · 9 of 36 slices shown (1 of 2)]
[im 1/36]
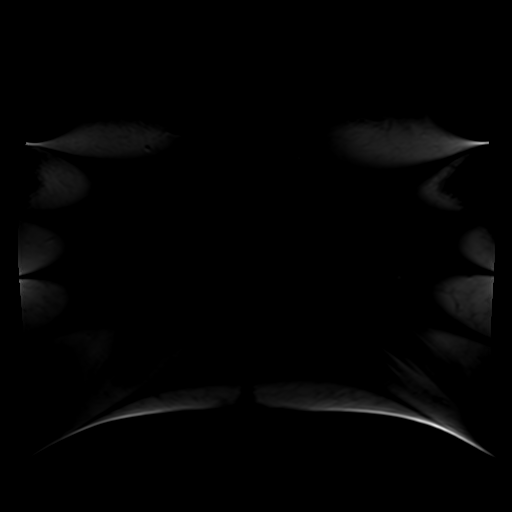
[im 5/36]
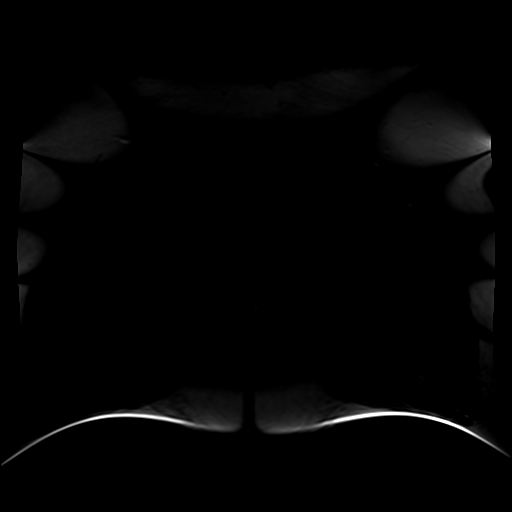
[im 9/36]
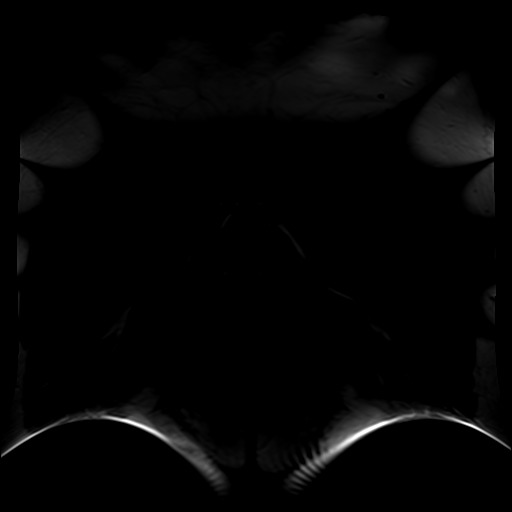
[im 14/36]
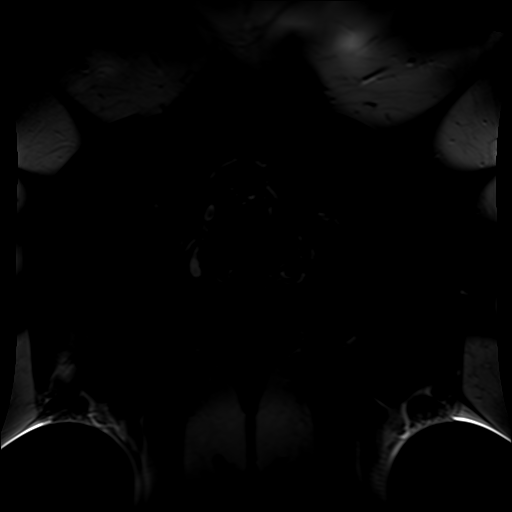
[im 18/36]
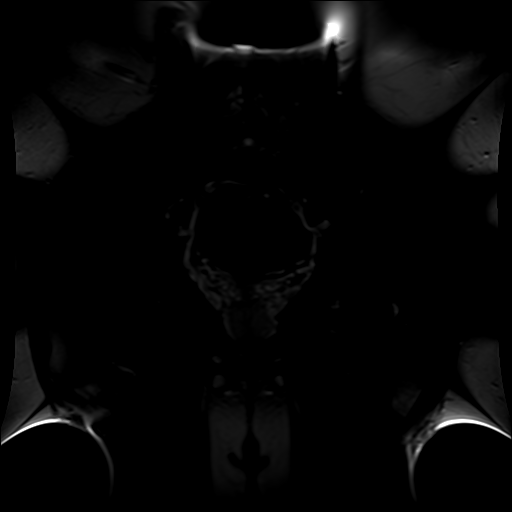
[im 22/36]
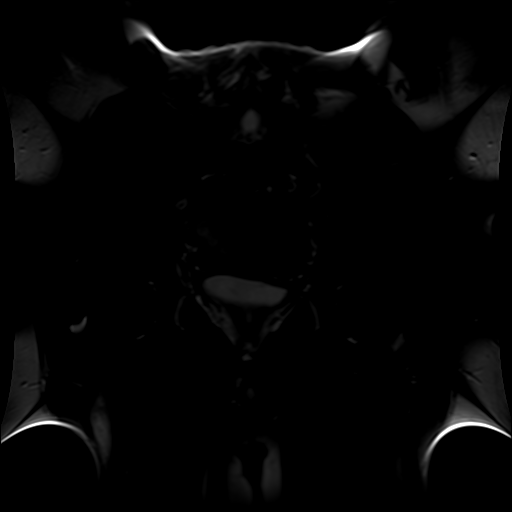
[im 27/36]
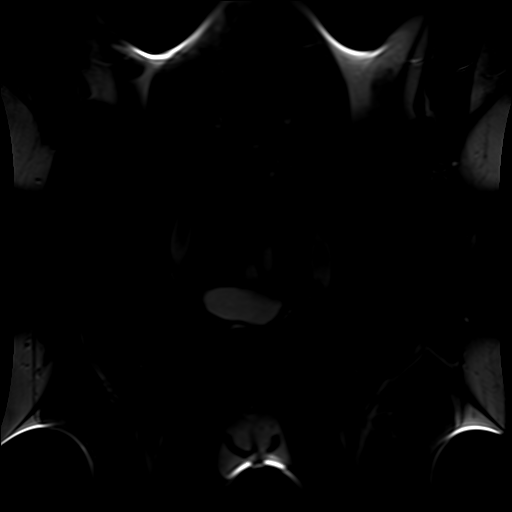
[im 31/36]
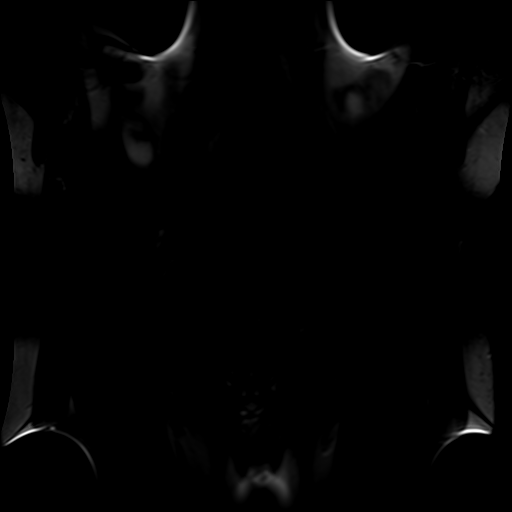
[im 36/36]
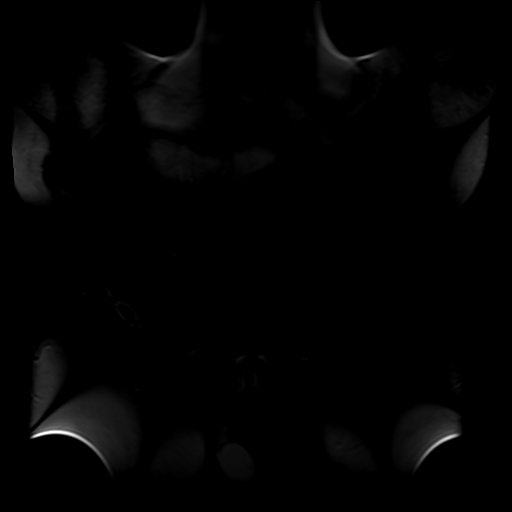

[Series 6: PD fat-sat · sagittal · 4.0mm · 0.70mm/px · 7 of 26 slices shown (1 of 2)]
[im 1/26]
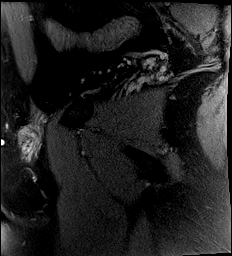
[im 5/26]
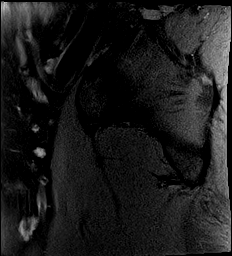
[im 9/26]
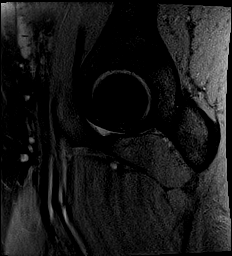
[im 13/26]
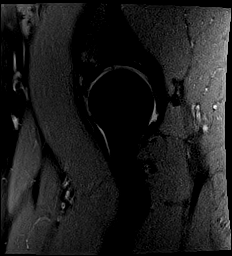
[im 17/26]
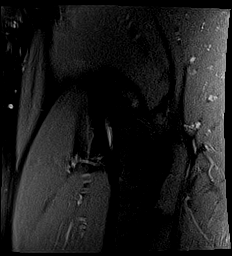
[im 21/26]
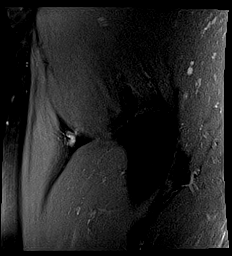
[im 26/26]
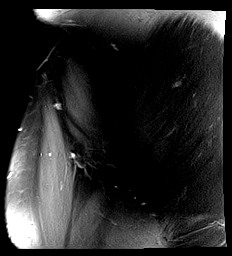

[Series 7: PD fat-sat · coronal · 4.0mm · 0.70mm/px · 6 of 23 slices shown (2 of 2)]
[im 1/23]
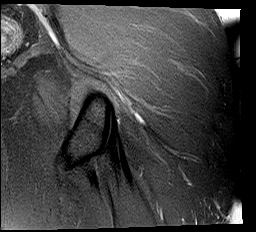
[im 5/23]
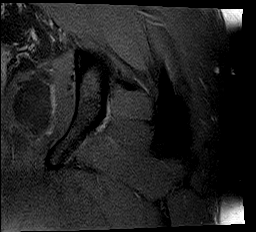
[im 9/23]
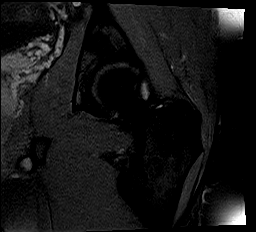
[im 14/23]
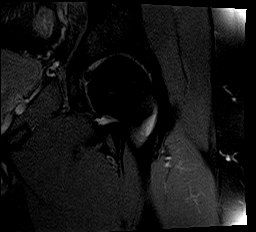
[im 18/23]
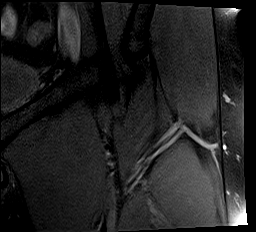
[im 23/23]
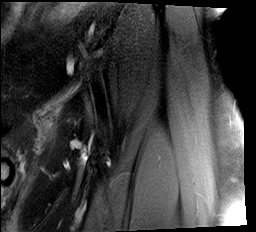

[Series 8: T2 fat-sat · axial · 4.0mm · 0.35mm/px · z∈[-120,+10]mm · 7 of 32 slices shown (2 of 2)]
[im 1/32]
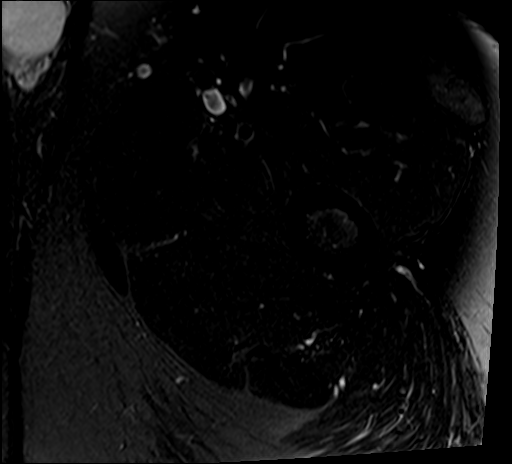
[im 5/32]
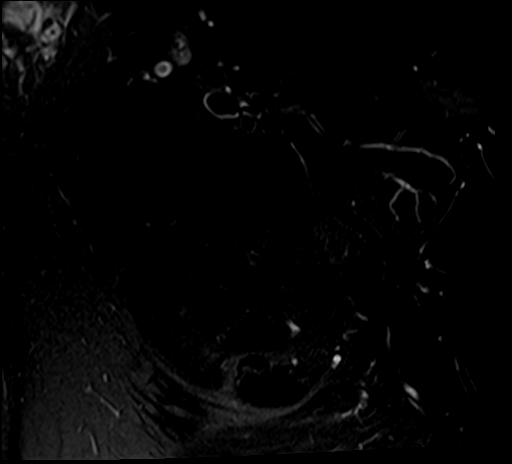
[im 9/32]
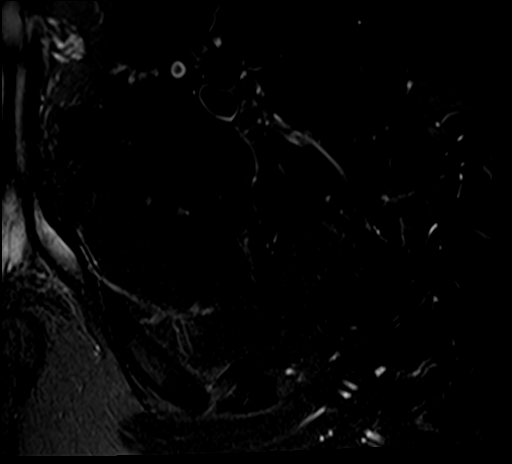
[im 14/32]
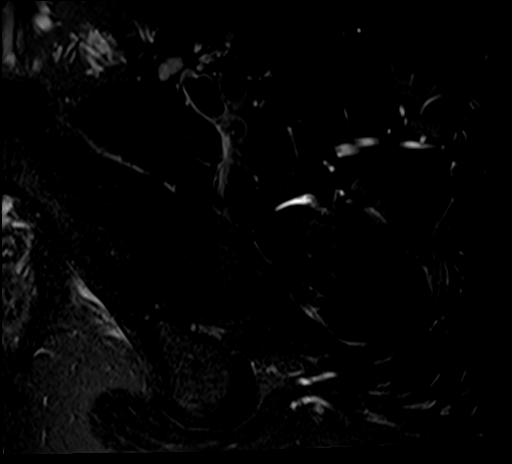
[im 18/32]
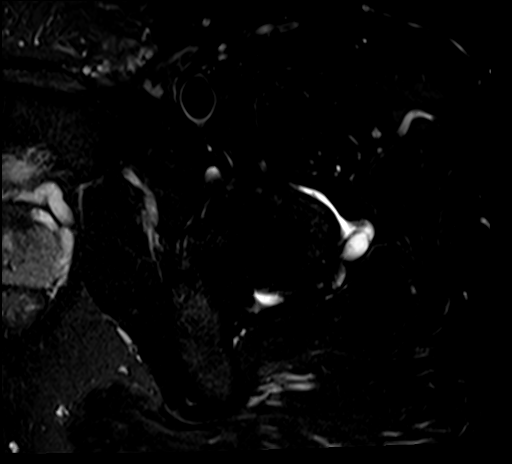
[im 23/32]
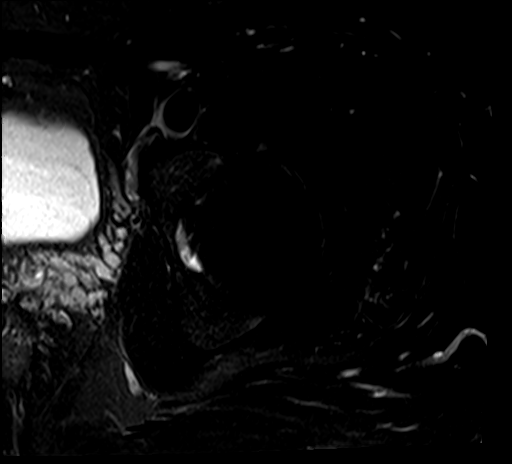
[im 27/32]
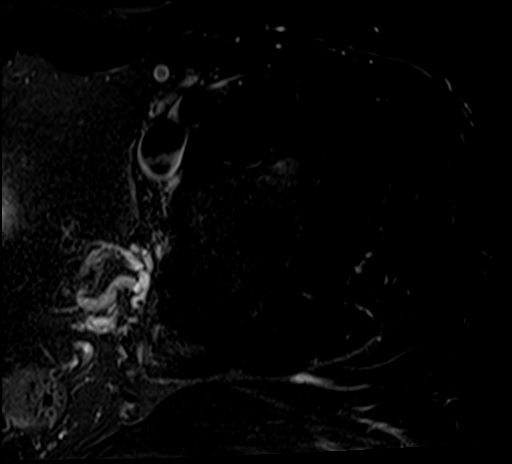

[29 of 40 positions shown; findings below may reference images not displayed]

FINDINGS: Bones: No hip fracture, dislocation or avascular necrosis. No
periosteal reaction or bone destruction. No aggressive osseous
lesion.

Normal sacrum and sacroiliac joints. No SI joint widening or erosive
changes.

Articular cartilage and labrum

Articular cartilage:  No chondral defect.

Labrum: Degeneration of the left labrum. Superior anterior left
labral tear.

Joint or bursal effusion

Joint effusion:  No hip joint effusion.  No SI joint effusion.

Bursae:  No bursa formation.

Muscles and tendons

Flexors: Normal.

Extensors: Normal.

Abductors: Normal.

Adductors: Mild muscle edema in the left tensor fascia lata muscle
concerning for mild muscle strain.

Gluteals: Normal.

Hamstrings: Normal.

Other findings

Miscellaneous: No pelvic free fluid. No fluid collection or
hematoma. No inguinal lymphadenopathy. No inguinal hernia.
IMPRESSION: 1. Degeneration of the left labrum. Superior anterior left labral
tear.
2. Mild muscle edema in the left tensor fascia lata muscle
concerning for mild muscle strain.

## 2019-09-12 ENCOUNTER — Other Ambulatory Visit: Payer: Self-pay | Admitting: Otolaryngology

## 2019-09-12 DIAGNOSIS — H9122 Sudden idiopathic hearing loss, left ear: Secondary | ICD-10-CM

## 2019-09-12 DIAGNOSIS — H9312 Tinnitus, left ear: Secondary | ICD-10-CM

## 2019-10-13 ENCOUNTER — Other Ambulatory Visit: Payer: Self-pay

## 2019-10-13 ENCOUNTER — Ambulatory Visit
Admission: RE | Admit: 2019-10-13 | Discharge: 2019-10-13 | Disposition: A | Discharge status: Home / Self Care | Payer: 59 | Source: Ambulatory Visit | Attending: Otolaryngology | Admitting: Otolaryngology

## 2019-10-13 DIAGNOSIS — H9122 Sudden idiopathic hearing loss, left ear: Secondary | ICD-10-CM

## 2019-10-13 DIAGNOSIS — H9312 Tinnitus, left ear: Secondary | ICD-10-CM

## 2019-10-13 MED ORDER — GADOBENATE DIMEGLUMINE 529 MG/ML IV SOLN
20.0000 mL | Freq: Once | INTRAVENOUS | Status: AC | PRN
  Administered 2019-10-13: 20 mL via INTRAVENOUS

## 2022-05-06 DIAGNOSIS — F988 Other specified behavioral and emotional disorders with onset usually occurring in childhood and adolescence: Secondary | ICD-10-CM | POA: Diagnosis not present

## 2022-05-06 DIAGNOSIS — H9042 Sensorineural hearing loss, unilateral, left ear, with unrestricted hearing on the contralateral side: Secondary | ICD-10-CM | POA: Diagnosis not present

## 2023-01-26 DIAGNOSIS — F988 Other specified behavioral and emotional disorders with onset usually occurring in childhood and adolescence: Secondary | ICD-10-CM | POA: Diagnosis not present

## 2023-01-26 DIAGNOSIS — H9042 Sensorineural hearing loss, unilateral, left ear, with unrestricted hearing on the contralateral side: Secondary | ICD-10-CM | POA: Diagnosis not present

## 2023-09-03 DIAGNOSIS — H9042 Sensorineural hearing loss, unilateral, left ear, with unrestricted hearing on the contralateral side: Secondary | ICD-10-CM | POA: Diagnosis not present

## 2023-09-03 DIAGNOSIS — F988 Other specified behavioral and emotional disorders with onset usually occurring in childhood and adolescence: Secondary | ICD-10-CM | POA: Diagnosis not present

## 2024-07-18 DIAGNOSIS — F988 Other specified behavioral and emotional disorders with onset usually occurring in childhood and adolescence: Secondary | ICD-10-CM | POA: Diagnosis not present
